# Patient Record
Sex: Female | Born: 1970 | Race: White | Hispanic: No | Marital: Married | Smoking: Never smoker
Health system: Southern US, Community
[De-identification: ages and names within clinical notes are randomized; demographics above are authoritative.]

## PROBLEM LIST (undated history)

## (undated) DIAGNOSIS — E079 Disorder of thyroid, unspecified: Secondary | ICD-10-CM

## (undated) DIAGNOSIS — I10 Essential (primary) hypertension: Secondary | ICD-10-CM

## (undated) DIAGNOSIS — D649 Anemia, unspecified: Secondary | ICD-10-CM

## (undated) HISTORY — PX: HERNIA REPAIR: SHX51

---

## 1997-09-25 ENCOUNTER — Inpatient Hospital Stay (HOSPITAL_COMMUNITY): Admission: AD | Admit: 1997-09-25 | Discharge: 1997-09-25 | Payer: Self-pay | Admitting: *Deleted

## 1997-10-21 ENCOUNTER — Observation Stay (HOSPITAL_COMMUNITY): Admission: AD | Admit: 1997-10-21 | Discharge: 1997-10-22 | Payer: Self-pay | Admitting: Obstetrics and Gynecology

## 1997-10-27 ENCOUNTER — Inpatient Hospital Stay (HOSPITAL_COMMUNITY): Admission: AD | Admit: 1997-10-27 | Discharge: 1997-10-27 | Payer: Self-pay | Admitting: Obstetrics and Gynecology

## 1997-10-29 ENCOUNTER — Inpatient Hospital Stay (HOSPITAL_COMMUNITY): Admission: AD | Admit: 1997-10-29 | Discharge: 1997-10-31 | Payer: Self-pay | Admitting: *Deleted

## 1997-11-01 ENCOUNTER — Encounter: Admission: RE | Admit: 1997-11-01 | Discharge: 1998-01-30 | Payer: Self-pay | Admitting: *Deleted

## 1999-07-24 ENCOUNTER — Ambulatory Visit (HOSPITAL_BASED_OUTPATIENT_CLINIC_OR_DEPARTMENT_OTHER): Admission: RE | Admit: 1999-07-24 | Discharge: 1999-07-24 | Payer: Self-pay | Admitting: General Surgery

## 2000-07-17 ENCOUNTER — Other Ambulatory Visit: Admission: RE | Admit: 2000-07-17 | Discharge: 2000-07-17 | Payer: Self-pay | Admitting: *Deleted

## 2000-09-15 ENCOUNTER — Inpatient Hospital Stay (HOSPITAL_COMMUNITY): Admission: AD | Admit: 2000-09-15 | Discharge: 2000-09-15 | Payer: Self-pay

## 2000-11-11 ENCOUNTER — Inpatient Hospital Stay (HOSPITAL_COMMUNITY): Admission: AD | Admit: 2000-11-11 | Discharge: 2000-11-11 | Payer: Self-pay | Admitting: *Deleted

## 2001-01-29 ENCOUNTER — Inpatient Hospital Stay (HOSPITAL_COMMUNITY): Admission: AD | Admit: 2001-01-29 | Discharge: 2001-01-29 | Payer: Self-pay | Admitting: *Deleted

## 2001-02-01 ENCOUNTER — Inpatient Hospital Stay (HOSPITAL_COMMUNITY): Admission: AD | Admit: 2001-02-01 | Discharge: 2001-02-01 | Payer: Self-pay | Admitting: *Deleted

## 2001-02-06 ENCOUNTER — Inpatient Hospital Stay (HOSPITAL_COMMUNITY): Admission: AD | Admit: 2001-02-06 | Discharge: 2001-02-06 | Payer: Self-pay | Admitting: *Deleted

## 2001-02-07 ENCOUNTER — Inpatient Hospital Stay (HOSPITAL_COMMUNITY): Admission: AD | Admit: 2001-02-07 | Discharge: 2001-02-07 | Payer: Self-pay | Admitting: *Deleted

## 2001-02-09 ENCOUNTER — Inpatient Hospital Stay (HOSPITAL_COMMUNITY): Admission: AD | Admit: 2001-02-09 | Discharge: 2001-02-09 | Payer: Self-pay | Admitting: *Deleted

## 2001-02-11 ENCOUNTER — Inpatient Hospital Stay (HOSPITAL_COMMUNITY): Admission: AD | Admit: 2001-02-11 | Discharge: 2001-02-13 | Payer: Self-pay | Admitting: *Deleted

## 2005-04-30 ENCOUNTER — Other Ambulatory Visit: Admission: RE | Admit: 2005-04-30 | Discharge: 2005-04-30 | Payer: Self-pay | Admitting: Obstetrics and Gynecology

## 2011-07-30 ENCOUNTER — Ambulatory Visit: Payer: Self-pay

## 2015-09-29 ENCOUNTER — Other Ambulatory Visit: Payer: Self-pay | Admitting: Obstetrics and Gynecology

## 2015-09-29 DIAGNOSIS — N63 Unspecified lump in unspecified breast: Secondary | ICD-10-CM

## 2015-10-06 ENCOUNTER — Ambulatory Visit
Admission: RE | Admit: 2015-10-06 | Discharge: 2015-10-06 | Disposition: A | Discharge status: Home / Self Care | Payer: Federal, State, Local not specified - PPO | Source: Ambulatory Visit | Attending: Obstetrics and Gynecology | Admitting: Obstetrics and Gynecology

## 2015-10-06 DIAGNOSIS — N63 Unspecified lump in unspecified breast: Secondary | ICD-10-CM

## 2016-02-08 DIAGNOSIS — K080 Exfoliation of teeth due to systemic causes: Secondary | ICD-10-CM | POA: Diagnosis not present

## 2016-02-09 DIAGNOSIS — K080 Exfoliation of teeth due to systemic causes: Secondary | ICD-10-CM | POA: Diagnosis not present

## 2016-02-15 DIAGNOSIS — K080 Exfoliation of teeth due to systemic causes: Secondary | ICD-10-CM | POA: Diagnosis not present

## 2016-03-12 DIAGNOSIS — K080 Exfoliation of teeth due to systemic causes: Secondary | ICD-10-CM | POA: Diagnosis not present

## 2016-03-16 DIAGNOSIS — E039 Hypothyroidism, unspecified: Secondary | ICD-10-CM | POA: Diagnosis not present

## 2016-03-16 DIAGNOSIS — I10 Essential (primary) hypertension: Secondary | ICD-10-CM | POA: Diagnosis not present

## 2016-03-16 DIAGNOSIS — E876 Hypokalemia: Secondary | ICD-10-CM | POA: Diagnosis not present

## 2016-03-21 DIAGNOSIS — E611 Iron deficiency: Secondary | ICD-10-CM | POA: Diagnosis not present

## 2016-03-21 DIAGNOSIS — R7301 Impaired fasting glucose: Secondary | ICD-10-CM | POA: Diagnosis not present

## 2016-03-21 DIAGNOSIS — E7800 Pure hypercholesterolemia, unspecified: Secondary | ICD-10-CM | POA: Diagnosis not present

## 2016-06-14 DIAGNOSIS — R0981 Nasal congestion: Secondary | ICD-10-CM | POA: Diagnosis not present

## 2016-06-14 DIAGNOSIS — H6993 Unspecified Eustachian tube disorder, bilateral: Secondary | ICD-10-CM | POA: Diagnosis not present

## 2016-08-30 DIAGNOSIS — R319 Hematuria, unspecified: Secondary | ICD-10-CM | POA: Insufficient documentation

## 2016-08-30 DIAGNOSIS — D649 Anemia, unspecified: Secondary | ICD-10-CM | POA: Insufficient documentation

## 2016-08-30 DIAGNOSIS — I10 Essential (primary) hypertension: Secondary | ICD-10-CM | POA: Insufficient documentation

## 2016-08-30 DIAGNOSIS — R1030 Lower abdominal pain, unspecified: Secondary | ICD-10-CM | POA: Diagnosis not present

## 2016-08-30 DIAGNOSIS — E039 Hypothyroidism, unspecified: Secondary | ICD-10-CM | POA: Insufficient documentation

## 2016-08-30 DIAGNOSIS — Z79899 Other long term (current) drug therapy: Secondary | ICD-10-CM | POA: Insufficient documentation

## 2016-08-30 DIAGNOSIS — R109 Unspecified abdominal pain: Secondary | ICD-10-CM | POA: Diagnosis not present

## 2016-08-31 ENCOUNTER — Emergency Department (HOSPITAL_COMMUNITY): Payer: Federal, State, Local not specified - PPO

## 2016-08-31 ENCOUNTER — Encounter (HOSPITAL_COMMUNITY): Payer: Self-pay

## 2016-08-31 ENCOUNTER — Emergency Department (HOSPITAL_COMMUNITY)
Admission: EM | Admit: 2016-08-31 | Discharge: 2016-08-31 | Disposition: A | Discharge status: Home / Self Care | Payer: Federal, State, Local not specified - PPO | Attending: Emergency Medicine | Admitting: Emergency Medicine

## 2016-08-31 DIAGNOSIS — R1084 Generalized abdominal pain: Secondary | ICD-10-CM | POA: Diagnosis not present

## 2016-08-31 DIAGNOSIS — R109 Unspecified abdominal pain: Secondary | ICD-10-CM | POA: Diagnosis not present

## 2016-08-31 DIAGNOSIS — R1030 Lower abdominal pain, unspecified: Secondary | ICD-10-CM

## 2016-08-31 DIAGNOSIS — D649 Anemia, unspecified: Secondary | ICD-10-CM

## 2016-08-31 DIAGNOSIS — R319 Hematuria, unspecified: Secondary | ICD-10-CM

## 2016-08-31 HISTORY — DX: Essential (primary) hypertension: I10

## 2016-08-31 HISTORY — DX: Anemia, unspecified: D64.9

## 2016-08-31 HISTORY — DX: Disorder of thyroid, unspecified: E07.9

## 2016-08-31 LAB — DIFFERENTIAL
BASOS PCT: 0 %
Basophils Absolute: 0 10*3/uL (ref 0.0–0.1)
EOS ABS: 0 10*3/uL (ref 0.0–0.7)
Eosinophils Relative: 0 %
LYMPHS ABS: 1.7 10*3/uL (ref 0.7–4.0)
Lymphocytes Relative: 8 %
MONO ABS: 0.9 10*3/uL (ref 0.1–1.0)
Monocytes Relative: 4 %
NEUTROS ABS: 18.7 10*3/uL — AB (ref 1.7–7.7)
NEUTROS PCT: 88 %

## 2016-08-31 LAB — CBC WITH DIFFERENTIAL/PLATELET
BASOS ABS: 0 10*3/uL (ref 0.0–0.1)
Basophils Relative: 0 %
Eosinophils Absolute: 0 10*3/uL (ref 0.0–0.7)
Eosinophils Relative: 0 %
HEMATOCRIT: 24.9 % — AB (ref 36.0–46.0)
Hemoglobin: 7 g/dL — ABNORMAL LOW (ref 12.0–15.0)
LYMPHS ABS: 2.1 10*3/uL (ref 0.7–4.0)
Lymphocytes Relative: 12 %
MCH: 17 pg — ABNORMAL LOW (ref 26.0–34.0)
MCHC: 28.1 g/dL — AB (ref 30.0–36.0)
MCV: 60.6 fL — ABNORMAL LOW (ref 78.0–100.0)
MONO ABS: 0.5 10*3/uL (ref 0.1–1.0)
MONOS PCT: 3 %
NEUTROS ABS: 14.5 10*3/uL — AB (ref 1.7–7.7)
Neutrophils Relative %: 85 %
Platelets: 287 10*3/uL (ref 150–400)
RBC: 4.11 MIL/uL (ref 3.87–5.11)
RDW: 18.1 % — AB (ref 11.5–15.5)
WBC: 17.1 10*3/uL — ABNORMAL HIGH (ref 4.0–10.5)

## 2016-08-31 LAB — COMPREHENSIVE METABOLIC PANEL
ALT: 36 U/L (ref 14–54)
ANION GAP: 12 (ref 5–15)
AST: 27 U/L (ref 15–41)
Albumin: 3.9 g/dL (ref 3.5–5.0)
Alkaline Phosphatase: 104 U/L (ref 38–126)
BUN: 7 mg/dL (ref 6–20)
CALCIUM: 9.5 mg/dL (ref 8.9–10.3)
CO2: 25 mmol/L (ref 22–32)
CREATININE: 0.78 mg/dL (ref 0.44–1.00)
Chloride: 100 mmol/L — ABNORMAL LOW (ref 101–111)
Glucose, Bld: 132 mg/dL — ABNORMAL HIGH (ref 65–99)
Potassium: 3.3 mmol/L — ABNORMAL LOW (ref 3.5–5.1)
SODIUM: 137 mmol/L (ref 135–145)
Total Bilirubin: 1.2 mg/dL (ref 0.3–1.2)
Total Protein: 7.3 g/dL (ref 6.5–8.1)

## 2016-08-31 LAB — URINALYSIS, ROUTINE W REFLEX MICROSCOPIC
BILIRUBIN URINE: NEGATIVE
Bacteria, UA: NONE SEEN
GLUCOSE, UA: NEGATIVE mg/dL
KETONES UR: NEGATIVE mg/dL
Nitrite: NEGATIVE
PROTEIN: NEGATIVE mg/dL
Specific Gravity, Urine: 1.017 (ref 1.005–1.030)
pH: 7 (ref 5.0–8.0)

## 2016-08-31 LAB — IRON AND TIBC
Iron: 11 ug/dL — ABNORMAL LOW (ref 28–170)
Saturation Ratios: 2 % — ABNORMAL LOW (ref 10.4–31.8)
TIBC: 553 ug/dL — AB (ref 250–450)
UIBC: 542 ug/dL

## 2016-08-31 LAB — VITAMIN B12: Vitamin B-12: 328 pg/mL (ref 180–914)

## 2016-08-31 LAB — CBC
HCT: 29.1 % — ABNORMAL LOW (ref 36.0–46.0)
HEMOGLOBIN: 8 g/dL — AB (ref 12.0–15.0)
MCH: 16.7 pg — ABNORMAL LOW (ref 26.0–34.0)
MCHC: 27.5 g/dL — ABNORMAL LOW (ref 30.0–36.0)
MCV: 60.9 fL — ABNORMAL LOW (ref 78.0–100.0)
PLATELETS: 341 10*3/uL (ref 150–400)
RBC: 4.78 MIL/uL (ref 3.87–5.11)
RDW: 17.6 % — ABNORMAL HIGH (ref 11.5–15.5)
WBC: 21.3 10*3/uL — AB (ref 4.0–10.5)

## 2016-08-31 LAB — LIPASE, BLOOD: Lipase: 23 U/L (ref 11–51)

## 2016-08-31 LAB — FERRITIN: FERRITIN: 5 ng/mL — AB (ref 11–307)

## 2016-08-31 LAB — PREGNANCY, URINE: PREG TEST UR: NEGATIVE

## 2016-08-31 LAB — FOLATE: Folate: 11 ng/mL (ref >5.9)

## 2016-08-31 LAB — RETICULOCYTES
RBC.: 4.58 MIL/uL (ref 3.87–5.11)
RETIC COUNT ABSOLUTE: 68.7 10*3/uL (ref 19.0–186.0)
RETIC CT PCT: 1.5 % (ref 0.4–3.1)

## 2016-08-31 MED ORDER — PROMETHAZINE HCL 25 MG PO TABS: 25.0000 mg | ORAL_TABLET | Freq: Four times a day (QID) | ORAL | 0 refills | Status: AC | PRN

## 2016-08-31 MED ORDER — CEPHALEXIN 500 MG PO CAPS: 500.0000 mg | ORAL_CAPSULE | Freq: Four times a day (QID) | ORAL | 0 refills | Status: DC

## 2016-08-31 MED ORDER — SODIUM CHLORIDE 0.9 % IV BOLUS (SEPSIS)
1000.0000 mL | Freq: Once | INTRAVENOUS | Status: AC
  Administered 2016-08-31: 1000 mL via INTRAVENOUS

## 2016-08-31 MED ORDER — IOPAMIDOL (ISOVUE-300) INJECTION 61%
INTRAVENOUS | Status: AC
  Administered 2016-08-31: 100 mL
  Filled 2016-08-31: qty 100

## 2016-08-31 MED ORDER — MORPHINE SULFATE (PF) 4 MG/ML IV SOLN
4.0000 mg | Freq: Once | INTRAVENOUS | Status: AC
  Administered 2016-08-31: 4 mg via INTRAVENOUS
  Filled 2016-08-31: qty 1

## 2016-08-31 MED ORDER — ONDANSETRON HCL 4 MG/2ML IJ SOLN
4.0000 mg | Freq: Once | INTRAMUSCULAR | Status: AC
  Administered 2016-08-31: 4 mg via INTRAVENOUS
  Filled 2016-08-31: qty 2

## 2016-08-31 MED ORDER — HYDROCODONE-ACETAMINOPHEN 5-325 MG PO TABS: 1.0000 | ORAL_TABLET | Freq: Four times a day (QID) | ORAL | 0 refills | Status: DC | PRN

## 2016-08-31 NOTE — Discharge Instructions (Signed)
Return for worse abdominal pain. Glad to hear that you're feeling better. Take the antibiotic for the hematuria. Urine culture sent. Important to follow-up with your regular doctor or your GYN doctor. You do have evidence of the anemia would recommend start taking the iron. Prescription for Keflex for the blood in the urine need to follow-up to make sure that it clears. Also take hydrocodone in the Phenergan as needed for abdominal pain and nausea.

## 2016-08-31 NOTE — ED Provider Notes (Addendum)
Patient turned over by Dr. Preston FleetingGlick. Patient to belly pain is now significant improved. Was evaluated by general surgery PA and then attending but in the meantime belly pain got sniffily better. Does have blood in the urine and no vaginal bleeding currently so sent urine culture and will treat with Keflex in case this is due to infection. Do not feel the patient's pain was all related to this. Patient now stable for discharge home does have follow-up with primary care doctor and GYN doctor. We'll have her urine rechecked will have her blood counts recheck she will start her iron and she will take her Keflex. She will return for any new or worse symptoms.  Abdomen is nontender on exam at this point in time.   Vanetta MuldersScott Valisa Karpel, MD 08/31/16 1043    Vanetta MuldersScott Shaundrea Carrigg, MD 08/31/16 1043

## 2016-08-31 NOTE — ED Notes (Signed)
Pt has a hx of umbilical hernia repair.

## 2016-08-31 NOTE — ED Provider Notes (Signed)
[facility]-EMERGENCY DEPT Provider Note   CSN: [id number] Arrival date & time: 08/30/16  2358   By signing my name below, I, Clovis PuAvnee Patel, attest that this documentation has been prepared under the direction and in the presence of Dione Boozeavid Mozella Rexrode, MD  Electronically Signed: Clovis PuAvnee Patel, ED Scribe. 08/31/16. 2:54 AM.   History   Chief Complaint Chief Complaint  Patient presents with  . Abdominal Pain   The history is provided by the patient. No language interpreter was used.   HPI Comments:  Suzanne Norton is a 46 y.o. female, with a hx of thyroid disease, HTN, anemia, umbilical hernia and PSHx of hernia repair, who presents to the Emergency Department complaining of persistent mid abdominal pain onset 1 week which worsened in the PM yesterday. Her pain is worse upon palpation. Pt also reports an episode of lightheadedness, nausea and chills. No alleviating factors noted. Pt denies fevers, diaphoresis, vomiting or any other associated symptoms. Pt notes she is currently not on her menstrual cycle and states that her cycle is irregular. Pt notes her HGB was 8.8 on 03/2016.  Past Medical History:  Diagnosis Date  . Anemia   . Hypertension   . Thyroid disease     There are no active problems to display for this patient.   Past Surgical History:  Procedure Laterality Date  . HERNIA REPAIR      OB History    No data available       Home Medications    Prior to Admission medications   Not on File    Family History No family history on file.  Social History Social History  Substance Use Topics  . Smoking status: Never Smoker  . Smokeless tobacco: Never Used  . Alcohol use No     Allergies   Patient has no known allergies.   Review of Systems Review of Systems  Constitutional: Positive for chills. Negative for diaphoresis and fever.  Gastrointestinal: Positive for abdominal pain and nausea. Negative for vomiting.  Genitourinary: Negative for vaginal bleeding.    Neurological: Positive for light-headedness.  All other systems reviewed and are negative.  Physical Exam Updated Vital Signs BP 158/75   Pulse 98   Temp 98.6 F (37 C) (Oral)   Resp 18   LMP 08/07/2016   SpO2 100%   Physical Exam  Constitutional: She is oriented to person, place, and time. She appears well-developed and well-nourished.  HENT:  Head: Normocephalic and atraumatic.  Eyes: EOM are normal. Pupils are equal, round, and reactive to light.  Neck: Normal range of motion. Neck supple. No JVD present.  Cardiovascular: Normal rate, regular rhythm and normal heart sounds.   No murmur heard. Pulmonary/Chest: Effort normal and breath sounds normal. She has no wheezes. She has no rales. She exhibits no tenderness.  Abdominal: Soft. Bowel sounds are normal. She exhibits no distension and no mass. There is tenderness in the epigastric area. There is rebound.  Diffuse tenderness with rebound tenderness. Maximum tenderness in midline. Worst tenderness of the epigastric area.   Musculoskeletal: Normal range of motion. She exhibits no edema.  Lymphadenopathy:    She has no cervical adenopathy.  Neurological: She is alert and oriented to person, place, and time. No cranial nerve deficit. She exhibits normal muscle tone. Coordination normal.  Skin: Skin is warm and dry. No rash noted.  Psychiatric: She has a normal mood and affect. Her behavior is normal. Judgment and thought content normal.  Nursing note and vitals reviewed.  ED Treatments / Results  DIAGNOSTIC STUDIES:  Oxygen Saturation is 100% on RA, normal by my interpretation.    COORDINATION OF CARE:  2:51 AM Discussed treatment plan with pt at bedside and pt agreed to plan.  Labs (all labs ordered are listed, but only abnormal results are displayed) Labs Reviewed  URINE CULTURE - Abnormal; Notable for the following:       Result Value   Culture MULTIPLE SPECIES PRESENT, SUGGEST RECOLLECTION (*)    All other  components within normal limits  COMPREHENSIVE METABOLIC PANEL - Abnormal; Notable for the following:    Potassium 3.3 (*)    Chloride 100 (*)    Glucose, Bld 132 (*)    All other components within normal limits  CBC - Abnormal; Notable for the following:    WBC 21.3 (*)    Hemoglobin 8.0 (*)    HCT 29.1 (*)    MCV 60.9 (*)    MCH 16.7 (*)    MCHC 27.5 (*)    RDW 17.6 (*)    All other components within normal limits  URINALYSIS, ROUTINE W REFLEX MICROSCOPIC - Abnormal; Notable for the following:    APPearance HAZY (*)    Hgb urine dipstick LARGE (*)    Leukocytes, UA SMALL (*)    Squamous Epithelial / LPF 0-5 (*)    All other components within normal limits  DIFFERENTIAL - Abnormal; Notable for the following:    Neutro Abs 18.7 (*)    All other components within normal limits  IRON AND TIBC - Abnormal; Notable for the following:    Iron 11 (*)    TIBC 553 (*)    Saturation Ratios 2 (*)    All other components within normal limits  FERRITIN - Abnormal; Notable for the following:    Ferritin 5 (*)    All other components within normal limits  CBC WITH DIFFERENTIAL/PLATELET - Abnormal; Notable for the following:    WBC 17.1 (*)    Hemoglobin 7.0 (*)    HCT 24.9 (*)    MCV 60.6 (*)    MCH 17.0 (*)    MCHC 28.1 (*)    RDW 18.1 (*)    Neutro Abs 14.5 (*)    All other components within normal limits  LIPASE, BLOOD  VITAMIN B12  FOLATE  RETICULOCYTES  PREGNANCY, URINE    Radiology Ct Abdomen Pelvis W Contrast  Result Date: 08/31/2016 CLINICAL DATA:  Abdominal pain for the past week on and off, worse today. Constipation today. EXAM: CT ABDOMEN AND PELVIS WITH CONTRAST TECHNIQUE: Multidetector CT imaging of the abdomen and pelvis was performed using the standard protocol following bolus administration of intravenous contrast. CONTRAST:  ISOVUE-300 IOPAMIDOL (ISOVUE-300) INJECTION 61% COMPARISON:  None. FINDINGS: Lower chest: Lung bases are clear. Hepatobiliary: No  focal liver abnormality is seen. No gallstones, gallbladder wall thickening, or biliary dilatation. Diffuse fatty infiltration of the liver. Pancreas: Unremarkable. No pancreatic ductal dilatation or surrounding inflammatory changes. Spleen: Normal in size without focal abnormality. Adrenals/Urinary Tract: Adrenal glands are unremarkable. Kidneys are normal, without renal calculi, focal lesion, or hydronephrosis. Bladder is unremarkable. Stomach/Bowel: Stomach is within normal limits. Appendix appears normal. No evidence of bowel wall thickening, distention, or inflammatory changes. Vascular/Lymphatic: No significant vascular findings are present. No enlarged abdominal or pelvic lymph nodes. Reproductive: Uterus is anteverted. Nodular appearance to the uterus consistent with fibroids. Ovaries are not enlarged. Other: No abdominal wall hernia or abnormality. No abdominopelvic ascites. Musculoskeletal: No acute or  significant osseous findings. IMPRESSION: No acute process demonstrated in the abdomen or pelvis. No evidence of bowel obstruction or inflammation. Uterine fibroids. Fatty infiltration of the liver. Electronically Signed   By: Burman Nieves M.D.   On: 08/31/2016 04:35    Procedures Procedures (including critical care time)  Medications Ordered in ED Medications  sodium chloride 0.9 % bolus 1,000 mL (0 mLs Intravenous Stopped 08/31/16 0413)  ondansetron (ZOFRAN) injection 4 mg (4 mg Intravenous Given 08/31/16 0323)  morphine 4 MG/ML injection 4 mg (4 mg Intravenous Given 08/31/16 0323)  iopamidol (ISOVUE-300) 61 % injection (100 mLs  Contrast Given 08/31/16 0402)     Initial Impression / Assessment and Plan / ED Course  I have reviewed the triage vital signs and the nursing notes.  Pertinent labs & imaging results that were available during my care of the patient were reviewed by me and considered in my medical decision making (see chart for details).  Abdominal pain of uncertain cause. She  has clear signs of generalized peritonitis, but pain is and tenderness are not well localized. She is sent for CT of abdomen and pelvis.  CT scan is unremarkable, but WBC is markedly elevated. Patient was observed in the ED and that CBC repeated showing modest drop in WBC but also drop in hemoglobin. Patient has known history of chronic anemia which is felt to be due to iron deficiency. Exam was repeated and tenderness now seems to be more localized into the right lower quadrant but still with rebound tenderness. I am concerned about her findings in spite of the negative CT scan. Gen. surgery has been consulted and will come to evaluate the patient in the ED. Case is turned over to Dr. Deretha Emory.  Final Clinical Impressions(s) / ED Diagnoses   Final diagnoses:  Lower abdominal pain  Hematuria, unspecified type  Anemia, unspecified type    New Prescriptions New Prescriptions   No medications on file   I personally performed the services described in this documentation, which was scribed in my presence. The recorded information has been reviewed and is accurate.       Dione Booze, MD 09/02/16 [phone]

## 2016-08-31 NOTE — Consult Note (Signed)
[facility] Surgery Consult/Admission Note  Suzanne Norton [birth year]  [id number].    Requesting MD: Dr. Preston Fleeting Chief Complaint/Reason for Consult: abdominal pain  HPI:   Pt is a 46 year old obese female with a past medical history significant for hypothyroidism, hypertension, iron deficiency anemia, umbilical hernia repair (2000), who presented to [facility] emergency department last night with complaints of abdominal pain for 1 week. Patient states she's been having mild, intermittent abdominal pain around her umbilical region for one week that progressively got worse yesterday around 5 PM. Patient states she had pain with a bowel movement and then the abdominal pain became constant, severe, 10/10, worse with movement, worse with riding in the car. Patient states roughly 2 hours later her abdominal pain decreased to a dull/constant, 2/10. At that time she had increased flatulence and chills. She was still having increased pain with bumps in the car. Pain currently at rest is a 2/10, constant, mild, nonradiating. She did not take anything for this prior to arrival to the ED. Patient states she had hematuria yesterday. She denies dysuria, nausea, vomiting, fever, diarrhea, blood in her stool, abnormal vaginal discharge, chest pain, shortness of breath, headaches, dizziness, loss consciousness. Patient states she is probably due to start her menstrual period soon. She does not member when her last LMP was.  ED course: Labs: Potassium 3.3, glucose 132, WBC 17.1, Hgb 7, iron 11  CT scan abdomen/pelvis: No acute process demonstrated in the abdomen or pelvis. No evidence of bowel obstruction or inflammation. Uterine fibroids. Fatty infiltration of the liver.  ROS:  Review of Systems  Constitutional: Positive for chills. Negative for diaphoresis and fever.  HENT: Negative for sore throat.   Eyes: Negative for discharge.  Respiratory: Negative for cough and shortness of breath.   Cardiovascular:  Negative for chest pain.  Gastrointestinal: Positive for abdominal pain. Negative for blood in stool, constipation, diarrhea, nausea and vomiting.  Genitourinary: Positive for hematuria. Negative for dysuria.  Musculoskeletal: Negative for back pain.  Skin: Negative for rash.  Neurological: Negative for dizziness, loss of consciousness and headaches.  All other systems reviewed and are negative.    No family history on file.  Past Medical History:  Diagnosis Date  . Anemia   . Hypertension   . Thyroid disease     Past Surgical History:  Procedure Laterality Date  . HERNIA REPAIR      Social History:  reports that she has never smoked. She has never used smokeless tobacco. She reports that she does not drink alcohol. Her drug history is not on file.  Allergies: No Known Allergies   (Not in a hospital admission)  Blood pressure 129/67, pulse 70, temperature 98.6 F (37 C), temperature source Oral, resp. rate 17, last menstrual period 08/07/2016, SpO2 100 %.  Physical Exam  Constitutional: She is oriented to person, place, and time and well-developed, well-nourished, and in no distress. Vital signs are normal. No distress.  Obese, white female, well-appearing  HENT:  Head: Normocephalic and atraumatic.  Nose: Nose normal.  Mouth/Throat: Oropharynx is clear and moist.  Eyes: Conjunctivae are normal. Right eye exhibits no discharge. Left eye exhibits no discharge. No scleral icterus.  Neck: Normal range of motion. Neck supple.  Cardiovascular: Normal rate, regular rhythm, normal heart sounds and intact distal pulses.   Pulses:      Radial pulses are 2+ on the right side, and 2+ on the left side.       Dorsalis pedis pulses are  2+ on the right side, and 2+ on the left side.  Pulmonary/Chest: Effort normal and breath sounds normal. No respiratory distress. She has no decreased breath sounds. She has no wheezes. She has no rhonchi. She has no rales.  Abdominal: Soft. Bowel  sounds are normal. She exhibits no distension and no mass. There is generalized tenderness. There is no rigidity, no rebound, no guarding, no CVA tenderness, no tenderness at McBurney's point and negative Murphy's sign.  Obese abdomen, small roughly 4 cm scar noted just inferior to the umbilicus, no increased tympany, no distention, generalized TTP worse in the RLQ, LLQ, inferior to the umbilicus. No rebound, guarding, rigidity.  Musculoskeletal: Normal range of motion. She exhibits no edema or deformity.  Neurological: She is alert and oriented to person, place, and time.  Skin: Skin is warm and dry. No rash noted. She is not diaphoretic.  Psychiatric: Mood and affect normal.  Nursing note and vitals reviewed.   Results for orders placed or performed during the hospital encounter of 08/31/16 (from the past 48 hour(s))  Lipase, blood     Status: None   Collection Time: 08/31/16 12:12 AM  Result Value Ref Range   Lipase 23 11 - 51 U/L  Comprehensive metabolic panel     Status: Abnormal   Collection Time: 08/31/16 12:12 AM  Result Value Ref Range   Sodium 137 135 - 145 mmol/L   Potassium 3.3 (L) 3.5 - 5.1 mmol/L   Chloride 100 (L) 101 - 111 mmol/L   CO2 25 22 - 32 mmol/L   Glucose, Bld 132 (H) 65 - 99 mg/dL   BUN 7 6 - 20 mg/dL   Creatinine, Ser 0.78 0.44 - 1.00 mg/dL   Calcium 9.5 8.9 - 10.3 mg/dL   Total Protein 7.3 6.5 - 8.1 g/dL   Albumin 3.9 3.5 - 5.0 g/dL   AST 27 15 - 41 U/L   ALT 36 14 - 54 U/L   Alkaline Phosphatase 104 38 - 126 U/L   Total Bilirubin 1.2 0.3 - 1.2 mg/dL   GFR calc non Af Norton >60 >60 mL/min   GFR calc Af Norton >60 >60 mL/min    Comment: (NOTE) The eGFR has been calculated using the CKD EPI equation. This calculation has not been validated in all clinical situations. eGFR's persistently <60 mL/min signify possible Chronic Kidney Disease.    Anion gap 12 5 - 15  CBC     Status: Abnormal   Collection Time: 08/31/16 12:12 AM  Result Value Ref Range   WBC  21.3 (H) 4.0 - 10.5 K/uL   RBC 4.78 3.87 - 5.11 MIL/uL   Hemoglobin 8.0 (L) 12.0 - 15.0 g/dL   HCT 29.1 (L) 36.0 - 46.0 %   MCV 60.9 (L) 78.0 - 100.0 fL   MCH 16.7 (L) 26.0 - 34.0 pg   MCHC 27.5 (L) 30.0 - 36.0 g/dL   RDW 17.6 (H) 11.5 - 15.5 %   Platelets 341 150 - 400 K/uL  Urinalysis, Routine w reflex microscopic     Status: Abnormal   Collection Time: 08/31/16 12:12 AM  Result Value Ref Range   Color, Urine YELLOW YELLOW   APPearance HAZY (A) CLEAR   Specific Gravity, Urine 1.017 1.005 - 1.030   pH 7.0 5.0 - 8.0   Glucose, UA NEGATIVE NEGATIVE mg/dL   Hgb urine dipstick LARGE (A) NEGATIVE   Bilirubin Urine NEGATIVE NEGATIVE   Ketones, ur NEGATIVE NEGATIVE mg/dL   Protein, ur NEGATIVE  NEGATIVE mg/dL   Nitrite NEGATIVE NEGATIVE   Leukocytes, UA SMALL (A) NEGATIVE   RBC / HPF TOO NUMEROUS TO COUNT 0 - 5 RBC/hpf   WBC, UA 6-30 0 - 5 WBC/hpf   Bacteria, UA NONE SEEN NONE SEEN   Squamous Epithelial / LPF 0-5 (A) NONE SEEN  Differential     Status: Abnormal   Collection Time: 08/31/16 12:12 AM  Result Value Ref Range   Neutrophils Relative % 88 %   Lymphocytes Relative 8 %   Monocytes Relative 4 %   Eosinophils Relative 0 %   Basophils Relative 0 %   Neutro Abs 18.7 (H) 1.7 - 7.7 K/uL   Lymphs Abs 1.7 0.7 - 4.0 K/uL   Monocytes Absolute 0.9 0.1 - 1.0 K/uL   Eosinophils Absolute 0.0 0.0 - 0.7 K/uL   Basophils Absolute 0.0 0.0 - 0.1 K/uL   RBC Morphology POLYCHROMASIA PRESENT     Comment: ELLIPTOCYTES  Pregnancy, urine     Status: None   Collection Time: 08/31/16 12:12 AM  Result Value Ref Range   Preg Test, Ur NEGATIVE NEGATIVE    Comment:        THE SENSITIVITY OF THIS METHODOLOGY IS >20 mIU/mL.   Vitamin B12     Status: None   Collection Time: 08/31/16  3:10 AM  Result Value Ref Range   Vitamin B-12 328 180 - 914 pg/mL    Comment: (NOTE) This assay is not validated for testing neonatal or myeloproliferative syndrome specimens for Vitamin B12 levels.   Folate      Status: None   Collection Time: 08/31/16  3:10 AM  Result Value Ref Range   Folate 11.0 >5.9 ng/mL  Iron and TIBC     Status: Abnormal   Collection Time: 08/31/16  3:10 AM  Result Value Ref Range   Iron 11 (L) 28 - 170 ug/dL   TIBC 553 (H) 250 - 450 ug/dL   Saturation Ratios 2 (L) 10.4 - 31.8 %   UIBC 542 ug/dL  Ferritin     Status: Abnormal   Collection Time: 08/31/16  3:10 AM  Result Value Ref Range   Ferritin 5 (L) 11 - 307 ng/mL  Reticulocytes     Status: None   Collection Time: 08/31/16  3:10 AM  Result Value Ref Range   Retic Ct Pct 1.5 0.4 - 3.1 %   RBC. 4.58 3.87 - 5.11 MIL/uL   Retic Count, Manual 68.7 19.0 - 186.0 K/uL  CBC with Differential     Status: Abnormal   Collection Time: 08/31/16  5:52 AM  Result Value Ref Range   WBC 17.1 (H) 4.0 - 10.5 K/uL   RBC 4.11 3.87 - 5.11 MIL/uL   Hemoglobin 7.0 (L) 12.0 - 15.0 g/dL   HCT 24.9 (L) 36.0 - 46.0 %   MCV 60.6 (L) 78.0 - 100.0 fL   MCH 17.0 (L) 26.0 - 34.0 pg   MCHC 28.1 (L) 30.0 - 36.0 g/dL   RDW 18.1 (H) 11.5 - 15.5 %   Platelets 287 150 - 400 K/uL   Neutrophils Relative % 85 %   Lymphocytes Relative 12 %   Monocytes Relative 3 %   Eosinophils Relative 0 %   Basophils Relative 0 %   Neutro Abs 14.5 (H) 1.7 - 7.7 K/uL   Lymphs Abs 2.1 0.7 - 4.0 K/uL   Monocytes Absolute 0.5 0.1 - 1.0 K/uL   Eosinophils Absolute 0.0 0.0 - 0.7 K/uL   Basophils  Absolute 0.0 0.0 - 0.1 K/uL   RBC Morphology HYPOCHROMIA     Comment: ELLIPTOCYTES   Ct Abdomen Pelvis W Contrast  Result Date: 08/31/2016 CLINICAL DATA:  Abdominal pain for the past week on and off, worse today. Constipation today. EXAM: CT ABDOMEN AND PELVIS WITH CONTRAST TECHNIQUE: Multidetector CT imaging of the abdomen and pelvis was performed using the standard protocol following bolus administration of intravenous contrast. CONTRAST:  161m ISOVUE-300 IOPAMIDOL (ISOVUE-300) INJECTION 61% COMPARISON:  None. FINDINGS: Lower chest: Lung bases are clear. Hepatobiliary:  No focal liver abnormality is seen. No gallstones, gallbladder wall thickening, or biliary dilatation. Diffuse fatty infiltration of the liver. Pancreas: Unremarkable. No pancreatic ductal dilatation or surrounding inflammatory changes. Spleen: Normal in size without focal abnormality. Adrenals/Urinary Tract: Adrenal glands are unremarkable. Kidneys are normal, without renal calculi, focal lesion, or hydronephrosis. Bladder is unremarkable. Stomach/Bowel: Stomach is within normal limits. Appendix appears normal. No evidence of bowel wall thickening, distention, or inflammatory changes. Vascular/Lymphatic: No significant vascular findings are present. No enlarged abdominal or pelvic lymph nodes. Reproductive: Uterus is anteverted. Nodular appearance to the uterus consistent with fibroids. Ovaries are not enlarged. Other: No abdominal wall hernia or abnormality. No abdominopelvic ascites. Musculoskeletal: No acute or significant osseous findings. IMPRESSION: No acute process demonstrated in the abdomen or pelvis. No evidence of bowel obstruction or inflammation. Uterine fibroids. Fatty infiltration of the liver. Electronically Signed   By: WLucienne CapersM.D.   On: 08/31/2016 04:35    Assessment/Plan  HTN Hypothyroidism And deficiency anemia  Abdominal pain  - Patient's abdominal pain has improved  - CT scan revealed no acute abdominal process to explain patient's symptoms, no appendicitis noted  - On exam patient has generalized abdominal tenderness no focal tenderness to suggest appendicitis, TTP equal on left and right side of lower abdomen  - leukocytosis: unsure of etiology but likely 2/2 viral enteritis. Patient is afebrile, not tachycardic. Vital signs are stable.  No acute need for surgical intervention at this time. We recommended fluids and symptomatic treatment   Thank you for the consult.   JKalman Drape PNorth [facility]ar[facility] Specialty HospitalSurgery 08/31/2016, 9:25 AM Pager:  [phone]: [phone]-Fri 7:00 am-4:30 pm Sat-Sun 7:00 am-11:30 am

## 2016-08-31 NOTE — ED Notes (Signed)
Ambulated pt to restroom, no issues. Pt back on monitor. 

## 2016-08-31 NOTE — ED Triage Notes (Signed)
Pt states that for the past week has been having abd pain on and off, worse today, reports some constipation today when using the bathroom, pain is lower, and some chills, no nausea/v, pt had a hernia repair 17 years ago

## 2016-09-01 LAB — URINE CULTURE

## 2016-10-15 DIAGNOSIS — R030 Elevated blood-pressure reading, without diagnosis of hypertension: Secondary | ICD-10-CM | POA: Diagnosis not present

## 2016-10-15 DIAGNOSIS — J181 Lobar pneumonia, unspecified organism: Secondary | ICD-10-CM | POA: Diagnosis not present

## 2016-10-24 DIAGNOSIS — E039 Hypothyroidism, unspecified: Secondary | ICD-10-CM | POA: Diagnosis not present

## 2016-10-24 DIAGNOSIS — R7303 Prediabetes: Secondary | ICD-10-CM | POA: Diagnosis not present

## 2016-10-24 DIAGNOSIS — I10 Essential (primary) hypertension: Secondary | ICD-10-CM | POA: Diagnosis not present

## 2016-10-24 DIAGNOSIS — E611 Iron deficiency: Secondary | ICD-10-CM | POA: Diagnosis not present

## 2016-12-29 DIAGNOSIS — J069 Acute upper respiratory infection, unspecified: Secondary | ICD-10-CM | POA: Diagnosis not present

## 2017-02-04 DIAGNOSIS — E039 Hypothyroidism, unspecified: Secondary | ICD-10-CM | POA: Diagnosis not present

## 2017-02-04 DIAGNOSIS — I10 Essential (primary) hypertension: Secondary | ICD-10-CM | POA: Diagnosis not present

## 2017-02-04 DIAGNOSIS — D509 Iron deficiency anemia, unspecified: Secondary | ICD-10-CM | POA: Diagnosis not present

## 2017-02-04 DIAGNOSIS — E876 Hypokalemia: Secondary | ICD-10-CM | POA: Diagnosis not present

## 2017-03-13 DIAGNOSIS — K080 Exfoliation of teeth due to systemic causes: Secondary | ICD-10-CM | POA: Diagnosis not present

## 2017-04-16 DIAGNOSIS — J029 Acute pharyngitis, unspecified: Secondary | ICD-10-CM | POA: Diagnosis not present

## 2018-01-26 IMAGING — CT CT ABD-PELV W/ CM
2 of 5 series · 11 of 46 positions shown, 12 images · IV contrast (Iodine)
Comparison: None.

CLINICAL DATA: Abdominal pain for the past week on and off, worse
today. Constipation today.

EXAM:
CT ABDOMEN AND PELVIS WITH CONTRAST
TECHNIQUE: Multidetector CT imaging of the abdomen and pelvis was performed
using the standard protocol following bolus administration of
intravenous contrast.
CONTRAST:  100mL [id number] IOPAMIDOL ([id number]) INJECTION 61%

[Series 201: routine, idose (2) · axial · 0.78mm/px · z∈[-507,-127]mm · 8 of 98 slices shown, 9 images]
[im 11/98  soft-tissue]
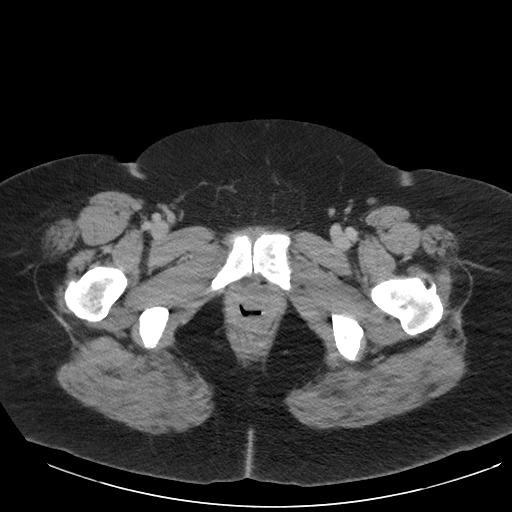
[im 11/98  bone]
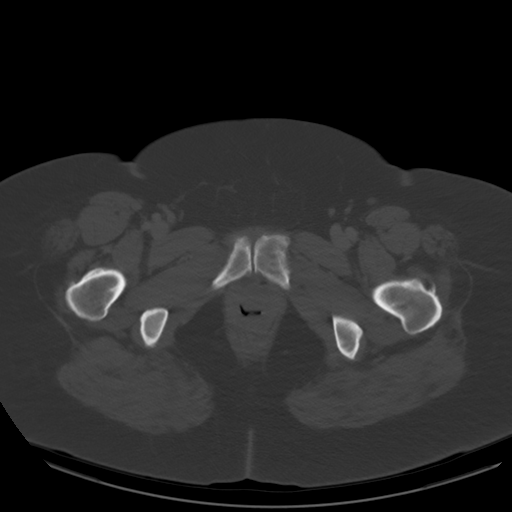
[im 21/98  soft-tissue]
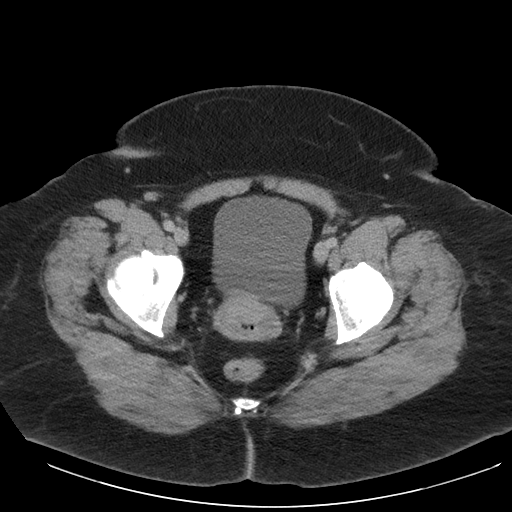
[im 31/98  soft-tissue]
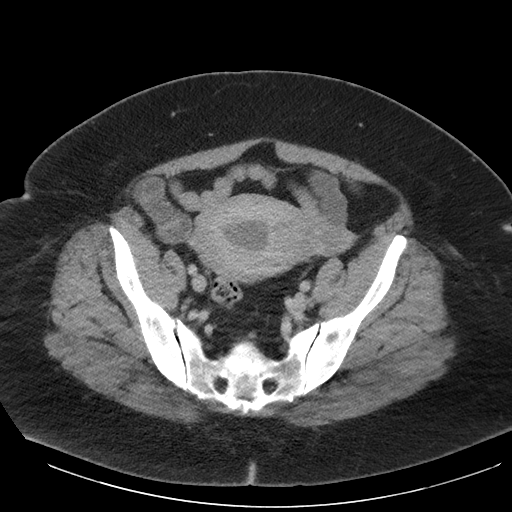
[im 41/98  soft-tissue]
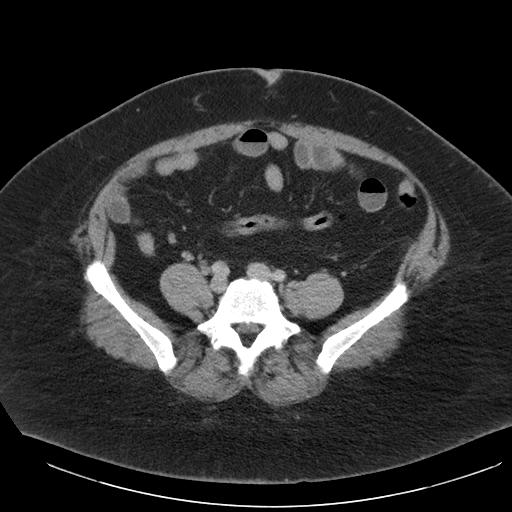
[im 57/98  soft-tissue]
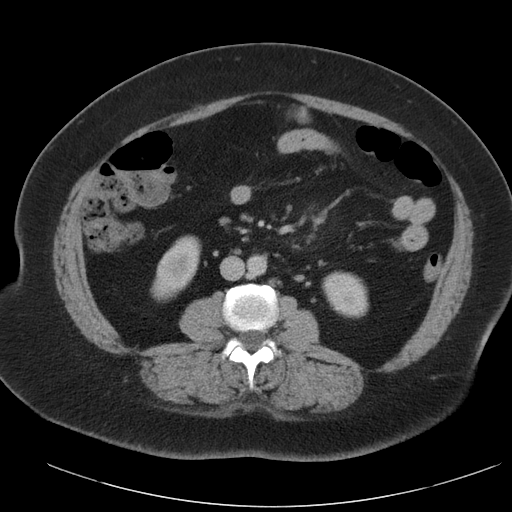
[im 67/98  soft-tissue]
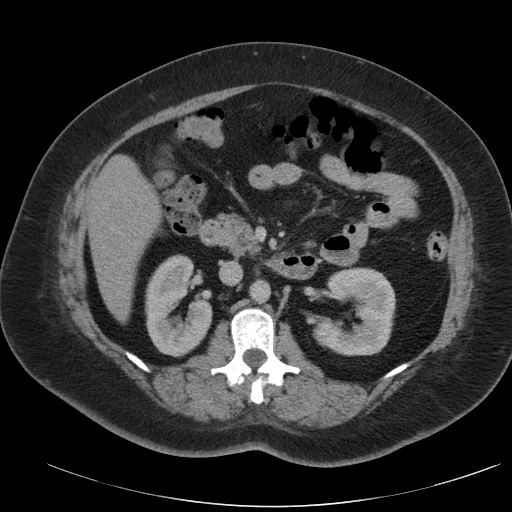
[im 77/98  soft-tissue]
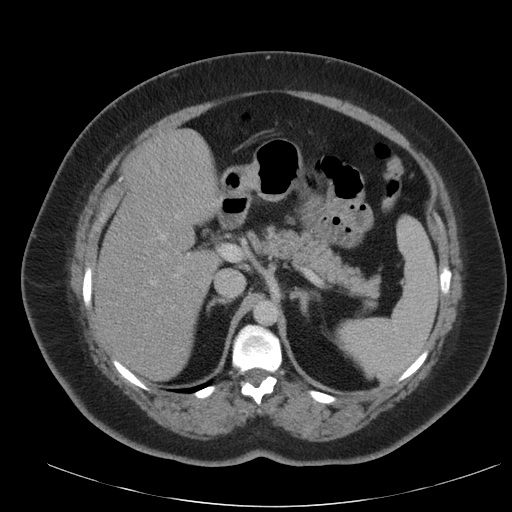
[im 87/98  soft-tissue]
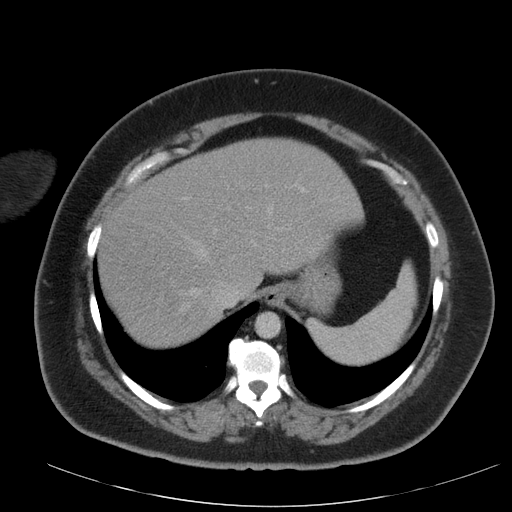

[Series 203: coronals, idose (2) · coronal · 0.45mm/px · 3 of 148 slices shown]
[im 50/148  soft-tissue]
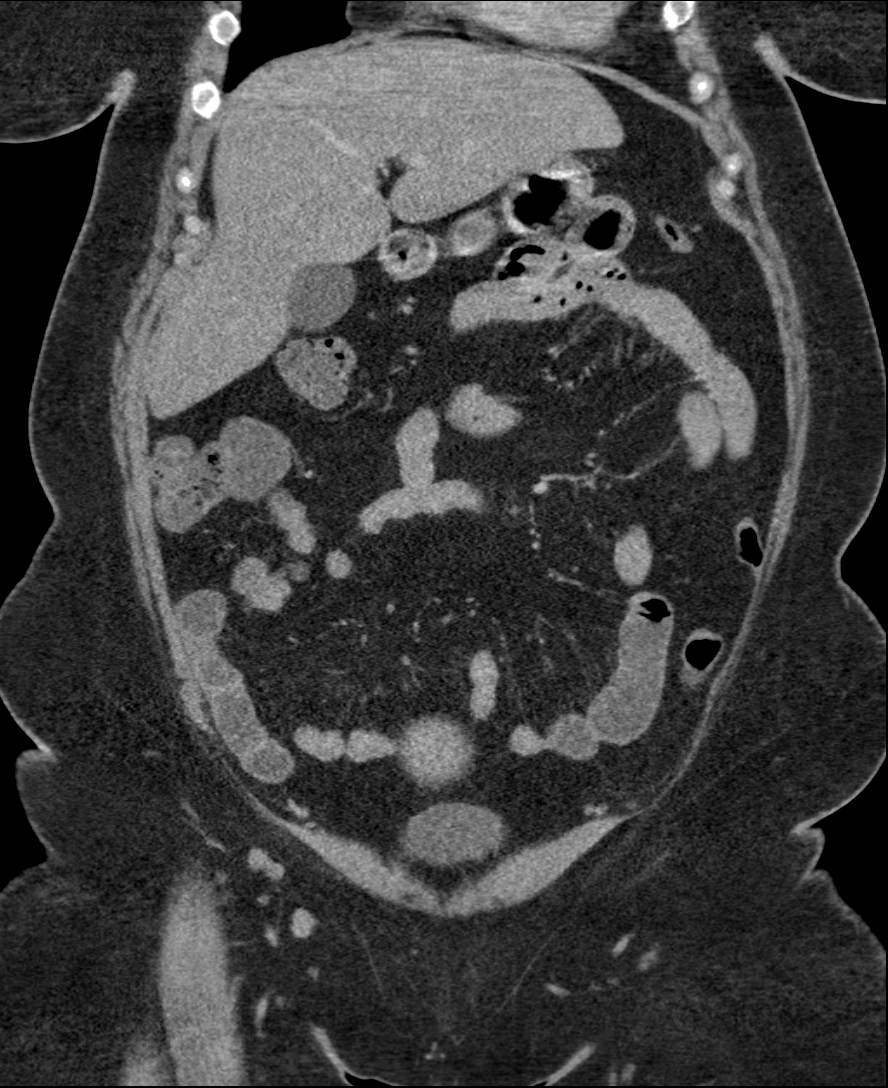
[im 66/148  soft-tissue]
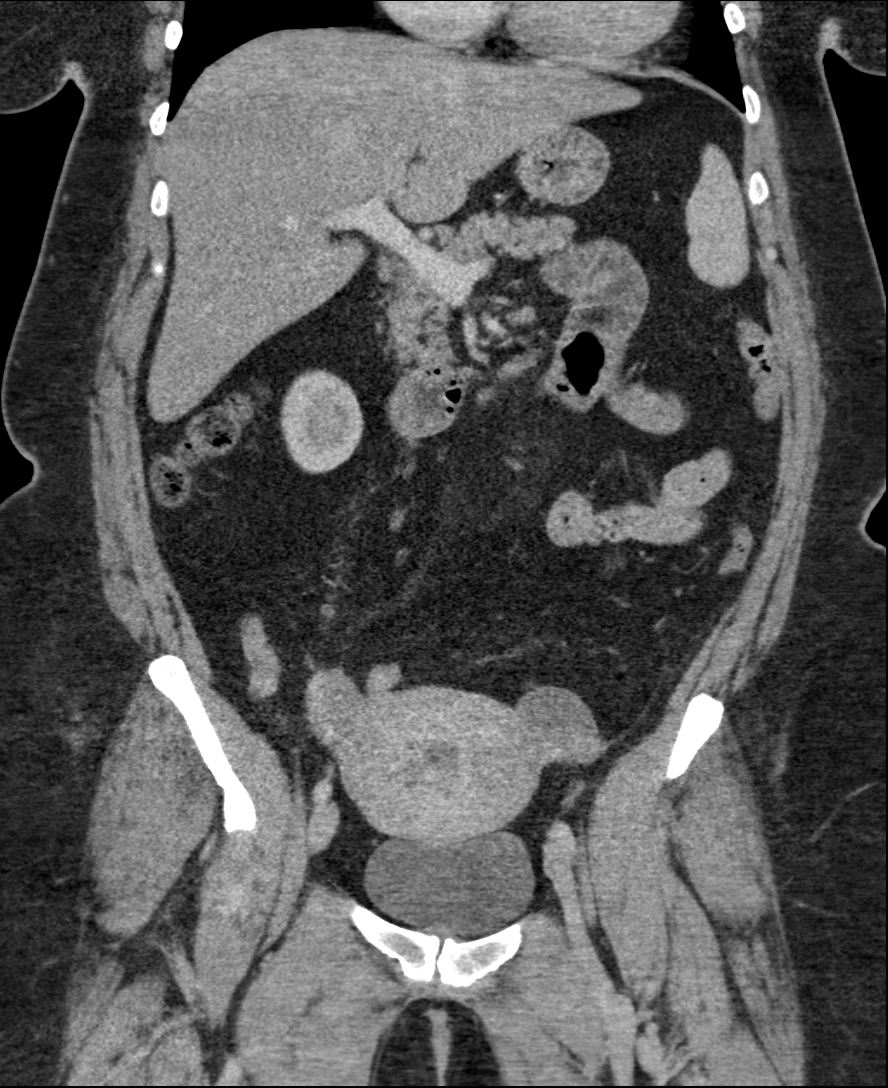
[im 82/148  soft-tissue]
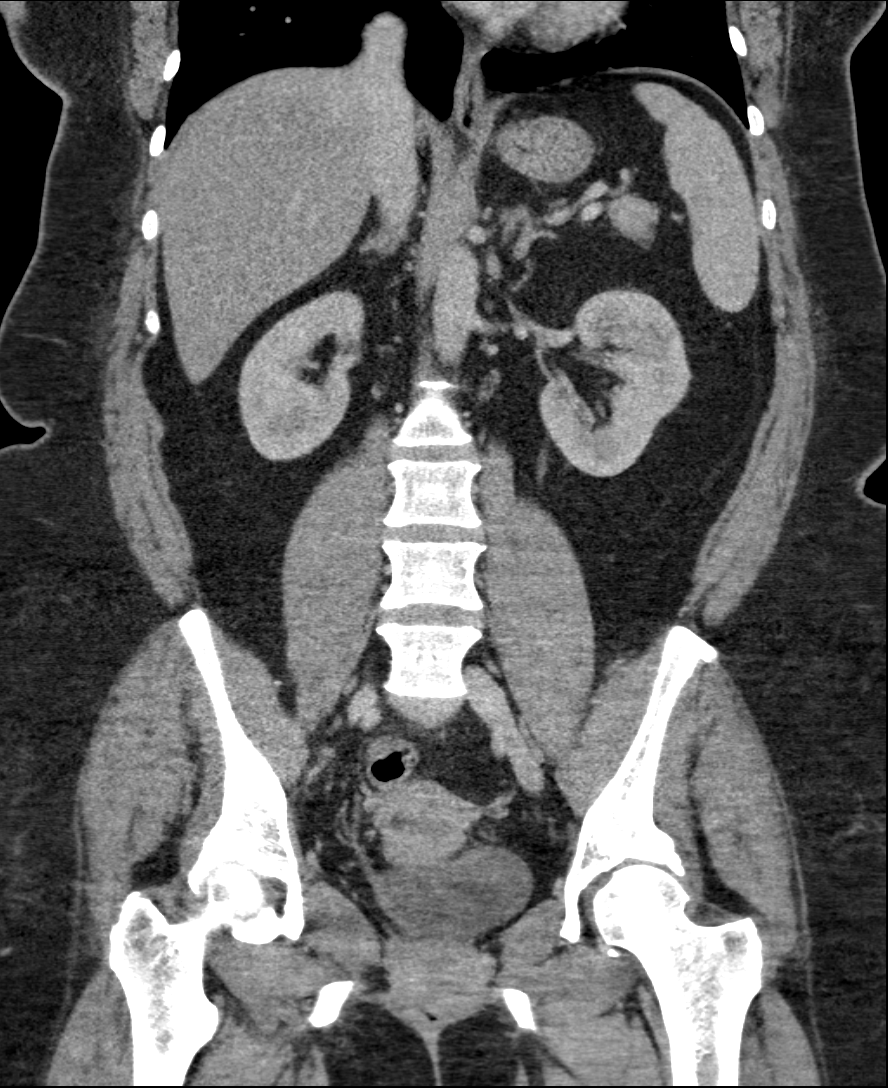

[11 of 46 positions shown; findings below may reference images not displayed]

FINDINGS: Lower chest: Lung bases are clear.

Hepatobiliary: No focal liver abnormality is seen. No gallstones,
gallbladder wall thickening, or biliary dilatation. Diffuse fatty
infiltration of the liver.

Pancreas: Unremarkable. No pancreatic ductal dilatation or
surrounding inflammatory changes.

Spleen: Normal in size without focal abnormality.

Adrenals/Urinary Tract: Adrenal glands are unremarkable. Kidneys are
normal, without renal calculi, focal lesion, or hydronephrosis.
Bladder is unremarkable.

Stomach/Bowel: Stomach is within normal limits. Appendix appears
normal. No evidence of bowel wall thickening, distention, or
inflammatory changes.

Vascular/Lymphatic: No significant vascular findings are present. No
enlarged abdominal or pelvic lymph nodes.

Reproductive: Uterus is anteverted. Nodular appearance to the uterus
consistent with fibroids. Ovaries are not enlarged.

Other: No abdominal wall hernia or abnormality. No abdominopelvic
ascites.

Musculoskeletal: No acute or significant osseous findings.
IMPRESSION: No acute process demonstrated in the abdomen or pelvis. No evidence
of bowel obstruction or inflammation. Uterine fibroids. Fatty
infiltration of the liver.

## 2018-02-12 DIAGNOSIS — S6991XA Unspecified injury of right wrist, hand and finger(s), initial encounter: Secondary | ICD-10-CM | POA: Diagnosis not present

## 2018-02-13 ENCOUNTER — Encounter (HOSPITAL_COMMUNITY): Payer: Self-pay | Admitting: Emergency Medicine

## 2018-02-13 ENCOUNTER — Emergency Department (HOSPITAL_COMMUNITY)
Admission: EM | Admit: 2018-02-13 | Discharge: 2018-02-13 | Disposition: A | Discharge status: Home / Self Care | Payer: Federal, State, Local not specified - PPO | Attending: Emergency Medicine | Admitting: Emergency Medicine

## 2018-02-13 DIAGNOSIS — X58XXXA Exposure to other specified factors, initial encounter: Secondary | ICD-10-CM | POA: Insufficient documentation

## 2018-02-13 DIAGNOSIS — Y998 Other external cause status: Secondary | ICD-10-CM | POA: Insufficient documentation

## 2018-02-13 DIAGNOSIS — I10 Essential (primary) hypertension: Secondary | ICD-10-CM | POA: Insufficient documentation

## 2018-02-13 DIAGNOSIS — E079 Disorder of thyroid, unspecified: Secondary | ICD-10-CM | POA: Insufficient documentation

## 2018-02-13 DIAGNOSIS — S60947A Unspecified superficial injury of left little finger, initial encounter: Secondary | ICD-10-CM | POA: Diagnosis not present

## 2018-02-13 DIAGNOSIS — S6992XA Unspecified injury of left wrist, hand and finger(s), initial encounter: Secondary | ICD-10-CM | POA: Diagnosis not present

## 2018-02-13 DIAGNOSIS — L089 Local infection of the skin and subcutaneous tissue, unspecified: Secondary | ICD-10-CM | POA: Insufficient documentation

## 2018-02-13 DIAGNOSIS — Z79899 Other long term (current) drug therapy: Secondary | ICD-10-CM | POA: Diagnosis not present

## 2018-02-13 DIAGNOSIS — Y9389 Activity, other specified: Secondary | ICD-10-CM | POA: Diagnosis not present

## 2018-02-13 DIAGNOSIS — Y929 Unspecified place or not applicable: Secondary | ICD-10-CM | POA: Diagnosis not present

## 2018-02-13 MED ORDER — CEPHALEXIN 500 MG PO CAPS: 500.0000 mg | ORAL_CAPSULE | Freq: Three times a day (TID) | ORAL | 0 refills | Status: DC

## 2018-02-13 MED ORDER — CEPHALEXIN 500 MG PO CAPS: 500.0000 mg | ORAL_CAPSULE | Freq: Four times a day (QID) | ORAL | 0 refills | Status: DC

## 2018-02-13 NOTE — ED Triage Notes (Signed)
Pt presents with swelling, redness, and puss draining from left pinky finger. Started on abx yesterday, pt reports redness has increased. Moves finger well, no change in sensation.

## 2018-02-13 NOTE — ED Provider Notes (Signed)
[facility]   CSN: [id number] Arrival date & time: 02/13/18  2207     History   Chief Complaint Chief Complaint  Patient presents with  . Hand Pain    HPI Suzanne Norton is a 47 y.o. female.  The history is provided by the patient. No language interpreter was used.  Hand Pain      47 year old female presenting for evaluation of left pinky finger infection.  Patient report approximately 10 days ago she had her nails done.  She had acrylic nail.  She noticed a small cut on the top of her skin near the nail fold and the next day it became more inflamed.  Since then it has loose out some clear fluid and placing up which concerns you.  She was seen at an outside urgent care center and was started on Bactrim antibiotic.  She took it for a total of 3 doses but today she noticed more redness to the affected area.  She denies any fever, chills, joint pain, numbness.  No history of diabetes.  She is up-to-date with tetanus.   Past Medical History:  Diagnosis Date  . Anemia   . Hypertension   . Thyroid disease     There are no active problems to display for this patient.   Past Surgical History:  Procedure Laterality Date  . HERNIA REPAIR       OB History   None      Home Medications    Prior to Admission medications   Medication Sig Start Date End Date Taking? Authorizing Provider  amLODipine (NORVASC) 2.5 MG tablet Take 2.5 mg by mouth daily.    [provider]  cephALEXin (KEFLEX) 500 MG capsule Take 1 capsule (500 mg total) by mouth 4 (four) times daily. 08/31/16   Vanetta MuldersZackowski, Scott, MD  hydrochlorothiazide (HYDRODIURIL) 25 MG tablet Take 25 mg by mouth daily.    [provider]  HYDROcodone-acetaminophen (NORCO/VICODIN) 5-325 MG tablet Take 1-2 tablets by mouth every 6 (six) hours as needed for moderate pain. 08/31/16   Vanetta MuldersZackowski, Scott, MD  levothyroxine (SYNTHROID, LEVOTHROID) 175 MCG tablet Take  175 mcg by mouth daily before breakfast.    [provider]  metoprolol succinate (TOPROL-XL) 25 MG 24 hr tablet Take 25 mg by mouth daily.    [provider]  potassium chloride SA (K-DUR,KLOR-CON) 20 MEQ tablet Take 20 mEq by mouth daily.    [provider]  promethazine (PHENERGAN) 25 MG tablet Take 1 tablet (25 mg total) by mouth every 6 (six) hours as needed for nausea or vomiting. 08/31/16   Vanetta MuldersZackowski, Scott, MD    Family History No family history on file.  Social History Social History   Tobacco Use  . Smoking status: Never Smoker  . Smokeless tobacco: Never Used  Substance Use Topics  . Alcohol use: No  . Drug use: Not on file     Allergies   Patient has no known allergies.   Review of Systems Review of Systems  Constitutional: Negative for fever.  Skin: Positive for rash.     Physical Exam Updated Vital Signs BP (!) 151/71 (BP Location: Right Arm)   Pulse 94   Temp 98.2 F (36.8 C) (Oral)   Resp 19   Ht 5\' 5"  (1.651 m)   Wt 124.7 kg (275 lb)   SpO2 100%   BMI 45.76 kg/m   Physical Exam  Constitutional: She appears well-developed and  well-nourished. No distress.  HENT:  Head: Atraumatic.  Eyes: Conjunctivae are normal.  Neck: Neck supple.  Neurological: She is alert.  Skin: Rash (Left pinky finger: An area of erythema with small blister oozing out serous fluid were noted to the dorsum of the distal phalanx adjacent to the nailbed without joint involvement.  Is mildly tender to palpation.  Acrylic nail in place.) noted.  Psychiatric: She has a normal mood and affect.  Nursing note and vitals reviewed.    ED Treatments / Results  Labs (all labs ordered are listed, but only abnormal results are displayed) Labs Reviewed - No data to display  EKG None  Radiology No results found.  Procedures Procedures (including critical care time)  Medications Ordered in ED Medications - No data to display   Initial Impression  / Assessment and Plan / ED Course  I have reviewed the triage vital signs and the nursing notes.  Pertinent labs & imaging results that were available during my care of the patient were reviewed by me and considered in my medical decision making (see chart for details).     BP (!) 151/71 (BP Location: Right Arm)   Pulse 94   Temp 98.2 F (36.8 C) (Oral)   Resp 19   Ht 5\' 5"  (1.651 m)   Wt 124.7 kg (275 lb)   SpO2 100%   BMI 45.76 kg/m    Final Clinical Impressions(s) / ED Diagnoses   Final diagnoses:  Superficial injury of left little finger with infection    ED Discharge Orders        Ordered    cephALEXin (KEFLEX) 500 MG capsule  4 times daily     02/13/18 2317    cephALEXin (KEFLEX) 500 MG capsule  3 times daily     02/13/18 2317     11:15 PM Patient has a localized infection noted to the dorsum of her left pinky finger at the distal phalanx adjacent to the nailbed.  It is likely cellulitis with blister formation.  No obvious abscess appreciated.  No joint involvement.  Patient just started her Bactrim which I encouraged to continue.  I will also add Keflex.  Encourage patient to return if symptoms worsen.  She is up-to-date.   Fayrene Helper, PA-C 02/13/18 2319    Mesner, Barbara Cower, MD 02/14/18 [phone]

## 2018-02-21 DIAGNOSIS — D509 Iron deficiency anemia, unspecified: Secondary | ICD-10-CM | POA: Diagnosis not present

## 2018-02-21 DIAGNOSIS — R7301 Impaired fasting glucose: Secondary | ICD-10-CM | POA: Diagnosis not present

## 2018-02-21 DIAGNOSIS — E039 Hypothyroidism, unspecified: Secondary | ICD-10-CM | POA: Diagnosis not present

## 2018-02-21 DIAGNOSIS — I10 Essential (primary) hypertension: Secondary | ICD-10-CM | POA: Diagnosis not present

## 2018-02-21 DIAGNOSIS — E7800 Pure hypercholesterolemia, unspecified: Secondary | ICD-10-CM | POA: Diagnosis not present

## 2018-03-31 DIAGNOSIS — H9311 Tinnitus, right ear: Secondary | ICD-10-CM | POA: Diagnosis not present

## 2018-08-18 ENCOUNTER — Ambulatory Visit: Payer: Federal, State, Local not specified - PPO

## 2018-08-18 ENCOUNTER — Encounter: Payer: Self-pay | Admitting: Emergency Medicine

## 2018-08-18 ENCOUNTER — Ambulatory Visit
Admission: EM | Admit: 2018-08-18 | Discharge: 2018-08-18 | Disposition: A | Discharge status: Home / Self Care | Payer: Federal, State, Local not specified - PPO | Attending: Family Medicine | Admitting: Family Medicine

## 2018-08-18 DIAGNOSIS — J019 Acute sinusitis, unspecified: Secondary | ICD-10-CM

## 2018-08-18 DIAGNOSIS — R0602 Shortness of breath: Secondary | ICD-10-CM | POA: Diagnosis not present

## 2018-08-18 DIAGNOSIS — R05 Cough: Secondary | ICD-10-CM | POA: Diagnosis not present

## 2018-08-18 MED ORDER — AMOXICILLIN-POT CLAVULANATE 875-125 MG PO TABS: 1.0000 | ORAL_TABLET | Freq: Two times a day (BID) | ORAL | 0 refills | Status: AC

## 2018-08-18 MED ORDER — PREDNISONE 50 MG PO TABS: ORAL_TABLET | ORAL | 0 refills | Status: AC

## 2018-08-18 MED ORDER — ALBUTEROL SULFATE HFA 108 (90 BASE) MCG/ACT IN AERS: 1.0000 | INHALATION_SPRAY | Freq: Four times a day (QID) | RESPIRATORY_TRACT | 0 refills | Status: AC | PRN

## 2018-08-18 MED ORDER — CETIRIZINE HCL 10 MG PO CAPS: 10.0000 mg | ORAL_CAPSULE | Freq: Every day | ORAL | 0 refills | Status: AC

## 2018-08-18 MED ORDER — BENZONATATE 200 MG PO CAPS: 200.0000 mg | ORAL_CAPSULE | Freq: Three times a day (TID) | ORAL | 0 refills | Status: AC | PRN

## 2018-08-18 NOTE — ED Provider Notes (Signed)
[facility]    CSN: [id number] Arrival date & time: 08/18/18  1824     History   Chief Complaint Chief Complaint  Patient presents with  . Shortness of Breath    HPI Suzanne Norton is a 48 y.o. female history of hypertension presenting today for evaluation of URI symptoms and shortness of breath.  Patient states that she has had cough and congestion for the past 1-1/2 weeks.  She notes that approximately 1 week ago she flew to [facility] which was at approximately our 45-minute plane ride.  While she was in the park she noted herself to be increasingly short of breath with exertion than normal.  She is also had fatigue.  Over the past couple of days she is noticed herself to be increasingly congested and is worried about her symptoms going into her chest.  She noted that she had some mild bilateral leg swelling with traveling, but this is normal for her.  Denies previous DVT/PE.  Denies smoking history.  Denies exogenous estrogen use.  Patient did have recent travel but was limited to less than 2 hours.  She is tried some Delsym and DayQuil with minimal relief.  Denies chest pain.  HPI  Past Medical History:  Diagnosis Date  . Anemia   . Hypertension   . Thyroid disease     There are no active problems to display for this patient.   Past Surgical History:  Procedure Laterality Date  . HERNIA REPAIR      OB History   No obstetric history on file.      Home Medications    Prior to Admission medications   Medication Sig Start Date End Date Taking? Authorizing Provider  amLODipine (NORVASC) 2.5 MG tablet Take 2.5 mg by mouth daily.   Yes [provider]  hydrochlorothiazide (HYDRODIURIL) 25 MG tablet Take 25 mg by mouth daily.   Yes [provider]  levothyroxine (SYNTHROID, LEVOTHROID) 175 MCG tablet Take 175 mcg by mouth daily before breakfast.   Yes [provider]  metoprolol succinate (TOPROL-XL) 25 MG 24 hr tablet Take 25 mg by  mouth daily.   Yes [provider]  potassium chloride SA (K-DUR,KLOR-CON) 20 MEQ tablet Take 20 mEq by mouth daily.   Yes [provider]  albuterol (PROVENTIL HFA;VENTOLIN HFA) 108 (90 Base) MCG/ACT inhaler Inhale 1-2 puffs into the lungs every 6 (six) hours as needed for wheezing or shortness of breath. 08/18/18   Wieters, Hallie C, PA-C  amoxicillin-clavulanate (AUGMENTIN) 875-125 MG tablet Take 1 tablet by mouth every 12 (twelve) hours for 10 days. 08/18/18 08/28/18  Wieters, Hallie C, PA-C  benzonatate (TESSALON) 200 MG capsule Take 1 capsule (200 mg total) by mouth 3 (three) times daily as needed for up to 7 days for cough. 08/18/18 08/25/18  Wieters, Hallie C, PA-C  Cetirizine HCl 10 MG CAPS Take 1 capsule (10 mg total) by mouth daily for 10 days. 08/18/18 08/28/18  Wieters, Hallie C, PA-C  predniSONE (DELTASONE) 50 MG tablet Take 1 tablet daily for the next 5 days with food 08/18/18   Wieters, Hallie C, PA-C  promethazine (PHENERGAN) 25 MG tablet Take 1 tablet (25 mg total) by mouth every 6 (six) hours as needed for nausea or vomiting. 08/31/16   Vanetta Mulders, MD    Family History History reviewed. No pertinent family history.  Social History Social History   Tobacco Use  . Smoking status: Never Smoker  . Smokeless tobacco: Never Used  Substance Use Topics  . Alcohol use: No  . Drug use: Not on file     Allergies   Codeine   Review of Systems Review of Systems  Constitutional: Positive for fatigue. Negative for activity change, appetite change, chills and fever.  HENT: Positive for congestion, rhinorrhea, sinus pressure and sore throat. Negative for ear pain and trouble swallowing.   Eyes: Negative for discharge and redness.  Respiratory: Positive for cough and shortness of breath. Negative for chest tightness and wheezing.   Cardiovascular: Negative for chest pain.  Gastrointestinal: Negative for abdominal pain, diarrhea, nausea and vomiting.  Musculoskeletal:  Negative for myalgias.  Skin: Negative for rash.  Neurological: Negative for dizziness, light-headedness and headaches.     Physical Exam Triage Vital Signs ED Triage Vitals  Enc Vitals Group     BP 08/18/18 1835 (!) 169/99     Pulse Rate 08/18/18 1835 87     Resp 08/18/18 1835 20     Temp 08/18/18 1835 97.8 F (36.6 C)     Temp Source 08/18/18 1835 Oral     SpO2 08/18/18 1835 97 %     Weight --      Height --      Head Circumference --      Peak Flow --      Pain Score 08/18/18 1836 0     Pain Loc --      Pain Edu? --      Excl. in GC? --    No data found.  Updated Vital Signs BP (!) 169/99 (BP Location: Right Arm)   Pulse 87   Temp 97.8 F (36.6 C) (Oral)   Resp 20   SpO2 97%   Visual Acuity Right Eye Distance:   Left Eye Distance:   Bilateral Distance:    Right Eye Near:   Left Eye Near:    Bilateral Near:     Physical Exam Vitals signs and nursing note reviewed.  Constitutional:      General: She is not in acute distress.    Appearance: She is well-developed.  HENT:     Head: Normocephalic and atraumatic.     Ears:     Comments: Bilateral ears without tenderness to palpation of external auricle, tragus and mastoid, EAC's without erythema or swelling, TM's with good bony landmarks and cone of light. Non erythematous.     Mouth/Throat:     Comments: Oral mucosa pink and moist, no tonsillar enlargement or exudate. Posterior pharynx patent and nonerythematous, no uvula deviation or swelling. Normal phonation. Eyes:     Conjunctiva/sclera: Conjunctivae normal.  Neck:     Musculoskeletal: Neck supple.  Cardiovascular:     Rate and Rhythm: Normal rate and regular rhythm.     Heart sounds: No murmur.  Pulmonary:     Effort: Pulmonary effort is normal. No respiratory distress.     Breath sounds: Normal breath sounds.     Comments: Breathing comfortably at rest, CTABL, no wheezing, rales or other adventitious sounds auscultated Abdominal:     Palpations:  Abdomen is soft.     Tenderness: There is no abdominal tenderness.  Skin:    General: Skin is warm and dry.  Neurological:     Mental Status: She is alert.      UC Treatments / Results  Labs (all labs ordered are listed, but only abnormal results are displayed) Labs Reviewed - No data to display  EKG None  Radiology Dg Chest 2 View  Result  Date: 08/18/2018 CLINICAL DATA:  Shortness of breath, cough, and congestion. EXAM: CHEST - 2 VIEW COMPARISON:  None. FINDINGS: The heart size and mediastinal contours are within normal limits. Both lungs are clear. The visualized skeletal structures are unremarkable. IMPRESSION: No active cardiopulmonary disease. Electronically Signed   By: Gerome Samavid  Williams III M.D   On: 08/18/2018 19:20    Procedures Procedures (including critical care time)  Medications Ordered in UC Medications - No data to display  Initial Impression / Assessment and Plan / UC Course  I have reviewed the triage vital signs and the nursing notes.  Pertinent labs & imaging results that were available during my care of the patient were reviewed by me and considered in my medical decision making (see chart for details).     Chest x-ray negative for pneumonia.  Will treat patient for sinusitis with Augmentin and prednisone.  Also recommended daily antihistamine, Tessalon for cough.  Albuterol inhaler as needed for shortness of breath.  Patient did have recent travel on an airplane, but was less than 2 hours, other risk factors negative for DVT/PE.  Will have patient monitor shortness of breath and follow-up in ED or with PCP if symptoms persisting.  Shortness of breath most likely related to URI symptoms.Discussed strict return precautions. Patient verbalized understanding and is agreeable with plan.  Final Clinical Impressions(s) / UC Diagnoses   Final diagnoses:  Acute sinusitis with symptoms > 10 days     Discharge Instructions     Please begin taking Augmentin twice  daily for the next 10 days, this will treat for sinus infection Please also use prednisone daily for the next 5 days with food on your stomach, take in the morning if you are able, this should help with inflammation in sinuses as well as lungs that could be contributing to your shortness of breath May use albuterol inhaler as needed for shortness of breath and any wheezing Begin daily cetirizine/Zyrtec to help with congestion and drainage Tessalon every 8 hours as needed for cough  Continue to monitor your temperature, breathing and symptoms, please follow-up if symptoms worsening, developing increased shortness of breath, difficulty breathing, chest pain, persistent symptoms.  If shortness of breath worsening please go to the emergency room.   ED Prescriptions    Medication Sig Dispense Auth. Provider   predniSONE (DELTASONE) 50 MG tablet Take 1 tablet daily for the next 5 days with food 5 tablet Wieters, Hallie C, PA-C   amoxicillin-clavulanate (AUGMENTIN) 875-125 MG tablet Take 1 tablet by mouth every 12 (twelve) hours for 10 days. 20 tablet Wieters, Hallie C, PA-C   Cetirizine HCl 10 MG CAPS Take 1 capsule (10 mg total) by mouth daily for 10 days. 10 capsule Wieters, Hallie C, PA-C   benzonatate (TESSALON) 200 MG capsule Take 1 capsule (200 mg total) by mouth 3 (three) times daily as needed for up to 7 days for cough. 28 capsule Wieters, Hallie C, PA-C   albuterol (PROVENTIL HFA;VENTOLIN HFA) 108 (90 Base) MCG/ACT inhaler Inhale 1-2 puffs into the lungs every 6 (six) hours as needed for wheezing or shortness of breath. 1 Inhaler Wieters, Hallie C, PA-C     Controlled Substance Prescriptions [place] Controlled Substance Registry consulted? Not Applicable   Lew DawesWieters, Hallie C, [place]PA-C 08/18/18 1952

## 2018-08-18 NOTE — Discharge Instructions (Addendum)
Please begin taking Augmentin twice daily for the next 10 days, this will treat for sinus infection Please also use prednisone daily for the next 5 days with food on your stomach, take in the morning if you are able, this should help with inflammation in sinuses as well as lungs that could be contributing to your shortness of breath May use albuterol inhaler as needed for shortness of breath and any wheezing Begin daily cetirizine/Zyrtec to help with congestion and drainage Tessalon every 8 hours as needed for cough  Continue to monitor your temperature, breathing and symptoms, please follow-up if symptoms worsening, developing increased shortness of breath, difficulty breathing, chest pain, persistent symptoms.  If shortness of breath worsening please go to the emergency room.

## 2018-08-18 NOTE — ED Triage Notes (Signed)
Pt presents to [facility] for assessment of shortness of breath after a URI 1.5 weeks ago.  Patient states she flew to [place] with the cold, and could not make it walking around the park without getting short of breath with exertion.  Fatigue, malaise.  Denies fevers.  C/o productive cough.

## 2018-08-18 NOTE — ED Notes (Signed)
Patient able to ambulate independently  

## 2018-08-27 DIAGNOSIS — S6992XA Unspecified injury of left wrist, hand and finger(s), initial encounter: Secondary | ICD-10-CM | POA: Diagnosis not present

## 2018-08-27 DIAGNOSIS — W19XXXA Unspecified fall, initial encounter: Secondary | ICD-10-CM | POA: Diagnosis not present

## 2018-09-10 DIAGNOSIS — R0602 Shortness of breath: Secondary | ICD-10-CM | POA: Diagnosis not present

## 2018-09-10 DIAGNOSIS — I10 Essential (primary) hypertension: Secondary | ICD-10-CM | POA: Diagnosis not present

## 2018-09-10 DIAGNOSIS — D509 Iron deficiency anemia, unspecified: Secondary | ICD-10-CM | POA: Diagnosis not present

## 2018-09-10 DIAGNOSIS — R7301 Impaired fasting glucose: Secondary | ICD-10-CM | POA: Diagnosis not present

## 2018-09-24 DIAGNOSIS — R7303 Prediabetes: Secondary | ICD-10-CM | POA: Diagnosis not present

## 2018-09-24 DIAGNOSIS — D509 Iron deficiency anemia, unspecified: Secondary | ICD-10-CM | POA: Diagnosis not present

## 2018-09-24 DIAGNOSIS — I10 Essential (primary) hypertension: Secondary | ICD-10-CM | POA: Diagnosis not present

## 2019-04-22 DIAGNOSIS — I10 Essential (primary) hypertension: Secondary | ICD-10-CM | POA: Diagnosis not present

## 2019-04-22 DIAGNOSIS — E039 Hypothyroidism, unspecified: Secondary | ICD-10-CM | POA: Diagnosis not present

## 2019-04-22 DIAGNOSIS — D509 Iron deficiency anemia, unspecified: Secondary | ICD-10-CM | POA: Diagnosis not present

## 2019-04-28 DIAGNOSIS — S60512A Abrasion of left hand, initial encounter: Secondary | ICD-10-CM | POA: Diagnosis not present

## 2019-05-04 DIAGNOSIS — E039 Hypothyroidism, unspecified: Secondary | ICD-10-CM | POA: Diagnosis not present

## 2019-05-04 DIAGNOSIS — Z6841 Body Mass Index (BMI) 40.0 and over, adult: Secondary | ICD-10-CM | POA: Diagnosis not present

## 2019-05-04 DIAGNOSIS — D509 Iron deficiency anemia, unspecified: Secondary | ICD-10-CM | POA: Diagnosis not present

## 2019-05-04 DIAGNOSIS — E7800 Pure hypercholesterolemia, unspecified: Secondary | ICD-10-CM | POA: Diagnosis not present

## 2019-05-04 DIAGNOSIS — R7303 Prediabetes: Secondary | ICD-10-CM | POA: Diagnosis not present

## 2019-05-04 DIAGNOSIS — I10 Essential (primary) hypertension: Secondary | ICD-10-CM | POA: Diagnosis not present

## 2019-12-25 DIAGNOSIS — D225 Melanocytic nevi of trunk: Secondary | ICD-10-CM | POA: Diagnosis not present

## 2019-12-25 DIAGNOSIS — D485 Neoplasm of uncertain behavior of skin: Secondary | ICD-10-CM | POA: Diagnosis not present

## 2019-12-25 DIAGNOSIS — L918 Other hypertrophic disorders of the skin: Secondary | ICD-10-CM | POA: Diagnosis not present

## 2020-01-13 DIAGNOSIS — D509 Iron deficiency anemia, unspecified: Secondary | ICD-10-CM | POA: Diagnosis not present

## 2020-01-13 DIAGNOSIS — R7309 Other abnormal glucose: Secondary | ICD-10-CM | POA: Diagnosis not present

## 2020-01-13 DIAGNOSIS — I10 Essential (primary) hypertension: Secondary | ICD-10-CM | POA: Diagnosis not present

## 2020-01-13 DIAGNOSIS — E7800 Pure hypercholesterolemia, unspecified: Secondary | ICD-10-CM | POA: Diagnosis not present

## 2020-01-13 DIAGNOSIS — E039 Hypothyroidism, unspecified: Secondary | ICD-10-CM | POA: Diagnosis not present

## 2020-01-13 DIAGNOSIS — Z0000 Encounter for general adult medical examination without abnormal findings: Secondary | ICD-10-CM | POA: Diagnosis not present

## 2020-02-29 DIAGNOSIS — R05 Cough: Secondary | ICD-10-CM | POA: Diagnosis not present

## 2020-02-29 DIAGNOSIS — R5383 Other fatigue: Secondary | ICD-10-CM | POA: Diagnosis not present

## 2020-02-29 DIAGNOSIS — J22 Unspecified acute lower respiratory infection: Secondary | ICD-10-CM | POA: Diagnosis not present

## 2020-02-29 DIAGNOSIS — J029 Acute pharyngitis, unspecified: Secondary | ICD-10-CM | POA: Diagnosis not present

## 2020-03-01 DIAGNOSIS — Z03818 Encounter for observation for suspected exposure to other biological agents ruled out: Secondary | ICD-10-CM | POA: Diagnosis not present

## 2020-03-01 DIAGNOSIS — R05 Cough: Secondary | ICD-10-CM | POA: Diagnosis not present

## 2020-03-02 DIAGNOSIS — B349 Viral infection, unspecified: Secondary | ICD-10-CM | POA: Diagnosis not present

## 2020-03-08 DIAGNOSIS — R05 Cough: Secondary | ICD-10-CM | POA: Diagnosis not present

## 2020-04-14 DIAGNOSIS — E876 Hypokalemia: Secondary | ICD-10-CM | POA: Diagnosis not present

## 2020-04-14 DIAGNOSIS — E039 Hypothyroidism, unspecified: Secondary | ICD-10-CM | POA: Diagnosis not present

## 2020-05-20 DIAGNOSIS — R7303 Prediabetes: Secondary | ICD-10-CM | POA: Diagnosis not present

## 2020-05-20 DIAGNOSIS — E876 Hypokalemia: Secondary | ICD-10-CM | POA: Diagnosis not present

## 2021-01-24 DIAGNOSIS — D500 Iron deficiency anemia secondary to blood loss (chronic): Secondary | ICD-10-CM | POA: Diagnosis not present

## 2021-01-24 DIAGNOSIS — I10 Essential (primary) hypertension: Secondary | ICD-10-CM | POA: Diagnosis not present

## 2021-01-24 DIAGNOSIS — R7309 Other abnormal glucose: Secondary | ICD-10-CM | POA: Diagnosis not present

## 2021-01-24 DIAGNOSIS — Z0000 Encounter for general adult medical examination without abnormal findings: Secondary | ICD-10-CM | POA: Diagnosis not present

## 2021-01-24 DIAGNOSIS — E039 Hypothyroidism, unspecified: Secondary | ICD-10-CM | POA: Diagnosis not present

## 2021-01-24 DIAGNOSIS — E7800 Pure hypercholesterolemia, unspecified: Secondary | ICD-10-CM | POA: Diagnosis not present

## 2021-01-27 ENCOUNTER — Other Ambulatory Visit: Payer: Self-pay | Admitting: Family Medicine

## 2021-01-27 DIAGNOSIS — R319 Hematuria, unspecified: Secondary | ICD-10-CM

## 2021-02-01 ENCOUNTER — Ambulatory Visit
Admission: RE | Admit: 2021-02-01 | Discharge: 2021-02-01 | Disposition: A | Discharge status: Home / Self Care | Payer: Federal, State, Local not specified - PPO | Source: Ambulatory Visit | Attending: Family Medicine | Admitting: Family Medicine

## 2021-02-01 ENCOUNTER — Other Ambulatory Visit: Payer: Self-pay

## 2021-02-01 DIAGNOSIS — R319 Hematuria, unspecified: Secondary | ICD-10-CM

## 2021-02-01 DIAGNOSIS — R109 Unspecified abdominal pain: Secondary | ICD-10-CM | POA: Diagnosis not present

## 2021-11-27 DIAGNOSIS — D128 Benign neoplasm of rectum: Secondary | ICD-10-CM | POA: Diagnosis not present

## 2021-11-27 DIAGNOSIS — D123 Benign neoplasm of transverse colon: Secondary | ICD-10-CM | POA: Diagnosis not present

## 2021-11-27 DIAGNOSIS — Z1211 Encounter for screening for malignant neoplasm of colon: Secondary | ICD-10-CM | POA: Diagnosis not present

## 2021-11-27 DIAGNOSIS — D120 Benign neoplasm of cecum: Secondary | ICD-10-CM | POA: Diagnosis not present

## 2022-02-05 DIAGNOSIS — E876 Hypokalemia: Secondary | ICD-10-CM | POA: Diagnosis not present

## 2022-02-05 DIAGNOSIS — E785 Hyperlipidemia, unspecified: Secondary | ICD-10-CM | POA: Diagnosis not present

## 2022-02-05 DIAGNOSIS — E039 Hypothyroidism, unspecified: Secondary | ICD-10-CM | POA: Diagnosis not present

## 2022-02-05 DIAGNOSIS — I10 Essential (primary) hypertension: Secondary | ICD-10-CM | POA: Diagnosis not present

## 2022-02-05 DIAGNOSIS — Z0000 Encounter for general adult medical examination without abnormal findings: Secondary | ICD-10-CM | POA: Diagnosis not present

## 2022-06-06 DIAGNOSIS — R7303 Prediabetes: Secondary | ICD-10-CM | POA: Diagnosis not present

## 2022-06-06 DIAGNOSIS — E039 Hypothyroidism, unspecified: Secondary | ICD-10-CM | POA: Diagnosis not present

## 2022-09-03 DIAGNOSIS — M7061 Trochanteric bursitis, right hip: Secondary | ICD-10-CM | POA: Diagnosis not present

## 2022-09-03 DIAGNOSIS — M1611 Unilateral primary osteoarthritis, right hip: Secondary | ICD-10-CM | POA: Diagnosis not present

## 2022-09-13 DIAGNOSIS — R2689 Other abnormalities of gait and mobility: Secondary | ICD-10-CM | POA: Diagnosis not present

## 2022-09-13 DIAGNOSIS — M7061 Trochanteric bursitis, right hip: Secondary | ICD-10-CM | POA: Diagnosis not present

## 2022-09-13 DIAGNOSIS — M25551 Pain in right hip: Secondary | ICD-10-CM | POA: Diagnosis not present

## 2022-09-18 DIAGNOSIS — R2689 Other abnormalities of gait and mobility: Secondary | ICD-10-CM | POA: Diagnosis not present

## 2022-09-18 DIAGNOSIS — M7061 Trochanteric bursitis, right hip: Secondary | ICD-10-CM | POA: Diagnosis not present

## 2022-09-18 DIAGNOSIS — M25551 Pain in right hip: Secondary | ICD-10-CM | POA: Diagnosis not present

## 2022-09-20 DIAGNOSIS — M7061 Trochanteric bursitis, right hip: Secondary | ICD-10-CM | POA: Diagnosis not present

## 2022-09-20 DIAGNOSIS — R2689 Other abnormalities of gait and mobility: Secondary | ICD-10-CM | POA: Diagnosis not present

## 2022-09-20 DIAGNOSIS — M25551 Pain in right hip: Secondary | ICD-10-CM | POA: Diagnosis not present

## 2022-09-24 DIAGNOSIS — M7061 Trochanteric bursitis, right hip: Secondary | ICD-10-CM | POA: Diagnosis not present

## 2022-09-24 DIAGNOSIS — R2689 Other abnormalities of gait and mobility: Secondary | ICD-10-CM | POA: Diagnosis not present

## 2022-09-24 DIAGNOSIS — M25551 Pain in right hip: Secondary | ICD-10-CM | POA: Diagnosis not present

## 2022-09-26 DIAGNOSIS — M25551 Pain in right hip: Secondary | ICD-10-CM | POA: Diagnosis not present

## 2022-09-26 DIAGNOSIS — R2689 Other abnormalities of gait and mobility: Secondary | ICD-10-CM | POA: Diagnosis not present

## 2022-09-26 DIAGNOSIS — M7061 Trochanteric bursitis, right hip: Secondary | ICD-10-CM | POA: Diagnosis not present

## 2022-11-21 DIAGNOSIS — Z1330 Encounter for screening examination for mental health and behavioral disorders, unspecified: Secondary | ICD-10-CM | POA: Diagnosis not present

## 2022-11-21 DIAGNOSIS — Z0000 Encounter for general adult medical examination without abnormal findings: Secondary | ICD-10-CM | POA: Diagnosis not present

## 2022-11-21 DIAGNOSIS — D509 Iron deficiency anemia, unspecified: Secondary | ICD-10-CM | POA: Diagnosis not present

## 2022-11-21 DIAGNOSIS — I10 Essential (primary) hypertension: Secondary | ICD-10-CM | POA: Diagnosis not present

## 2022-11-21 DIAGNOSIS — R739 Hyperglycemia, unspecified: Secondary | ICD-10-CM | POA: Diagnosis not present

## 2022-11-21 DIAGNOSIS — R600 Localized edema: Secondary | ICD-10-CM | POA: Diagnosis not present

## 2023-02-12 DIAGNOSIS — E039 Hypothyroidism, unspecified: Secondary | ICD-10-CM | POA: Diagnosis not present

## 2023-02-12 DIAGNOSIS — R7303 Prediabetes: Secondary | ICD-10-CM | POA: Diagnosis not present

## 2023-02-12 DIAGNOSIS — Z0000 Encounter for general adult medical examination without abnormal findings: Secondary | ICD-10-CM | POA: Diagnosis not present

## 2023-02-12 DIAGNOSIS — E782 Mixed hyperlipidemia: Secondary | ICD-10-CM | POA: Diagnosis not present

## 2023-02-12 DIAGNOSIS — Z23 Encounter for immunization: Secondary | ICD-10-CM | POA: Diagnosis not present

## 2023-02-12 DIAGNOSIS — I10 Essential (primary) hypertension: Secondary | ICD-10-CM | POA: Diagnosis not present

## 2023-02-26 DIAGNOSIS — Z1231 Encounter for screening mammogram for malignant neoplasm of breast: Secondary | ICD-10-CM | POA: Diagnosis not present

## 2023-03-26 DIAGNOSIS — R87610 Atypical squamous cells of undetermined significance on cytologic smear of cervix (ASC-US): Secondary | ICD-10-CM | POA: Diagnosis not present

## 2023-03-26 DIAGNOSIS — Z6841 Body Mass Index (BMI) 40.0 and over, adult: Secondary | ICD-10-CM | POA: Diagnosis not present

## 2023-03-26 DIAGNOSIS — Z01419 Encounter for gynecological examination (general) (routine) without abnormal findings: Secondary | ICD-10-CM | POA: Diagnosis not present

## 2023-03-26 DIAGNOSIS — N3281 Overactive bladder: Secondary | ICD-10-CM | POA: Diagnosis not present

## 2023-04-17 DIAGNOSIS — Z23 Encounter for immunization: Secondary | ICD-10-CM | POA: Diagnosis not present

## 2023-04-17 DIAGNOSIS — R748 Abnormal levels of other serum enzymes: Secondary | ICD-10-CM | POA: Diagnosis not present

## 2023-04-17 DIAGNOSIS — E039 Hypothyroidism, unspecified: Secondary | ICD-10-CM | POA: Diagnosis not present

## 2023-05-24 DIAGNOSIS — E039 Hypothyroidism, unspecified: Secondary | ICD-10-CM | POA: Diagnosis not present

## 2023-11-24 DIAGNOSIS — R051 Acute cough: Secondary | ICD-10-CM | POA: Diagnosis not present

## 2023-11-24 DIAGNOSIS — R0981 Nasal congestion: Secondary | ICD-10-CM | POA: Diagnosis not present

## 2023-12-24 DIAGNOSIS — E785 Hyperlipidemia, unspecified: Secondary | ICD-10-CM | POA: Diagnosis not present

## 2023-12-24 DIAGNOSIS — E876 Hypokalemia: Secondary | ICD-10-CM | POA: Diagnosis not present

## 2023-12-24 DIAGNOSIS — E039 Hypothyroidism, unspecified: Secondary | ICD-10-CM | POA: Diagnosis not present

## 2023-12-24 DIAGNOSIS — R7303 Prediabetes: Secondary | ICD-10-CM | POA: Diagnosis not present

## 2023-12-24 DIAGNOSIS — I10 Essential (primary) hypertension: Secondary | ICD-10-CM | POA: Diagnosis not present

## 2023-12-24 DIAGNOSIS — R232 Flushing: Secondary | ICD-10-CM | POA: Diagnosis not present

## 2023-12-24 DIAGNOSIS — D509 Iron deficiency anemia, unspecified: Secondary | ICD-10-CM | POA: Diagnosis not present

## 2024-01-23 DIAGNOSIS — L680 Hirsutism: Secondary | ICD-10-CM | POA: Diagnosis not present

## 2024-01-23 DIAGNOSIS — I10 Essential (primary) hypertension: Secondary | ICD-10-CM | POA: Diagnosis not present

## 2024-01-23 DIAGNOSIS — N3281 Overactive bladder: Secondary | ICD-10-CM | POA: Diagnosis not present

## 2024-01-23 DIAGNOSIS — E119 Type 2 diabetes mellitus without complications: Secondary | ICD-10-CM | POA: Diagnosis not present

## 2024-02-20 DIAGNOSIS — N3281 Overactive bladder: Secondary | ICD-10-CM | POA: Diagnosis not present

## 2024-02-20 DIAGNOSIS — E669 Obesity, unspecified: Secondary | ICD-10-CM | POA: Diagnosis not present

## 2024-02-20 DIAGNOSIS — I10 Essential (primary) hypertension: Secondary | ICD-10-CM | POA: Diagnosis not present

## 2024-04-30 DIAGNOSIS — N3281 Overactive bladder: Secondary | ICD-10-CM | POA: Diagnosis not present

## 2024-04-30 DIAGNOSIS — I10 Essential (primary) hypertension: Secondary | ICD-10-CM | POA: Diagnosis not present

## 2024-05-29 DIAGNOSIS — L237 Allergic contact dermatitis due to plants, except food: Secondary | ICD-10-CM | POA: Diagnosis not present

## 2024-05-29 DIAGNOSIS — L0390 Cellulitis, unspecified: Secondary | ICD-10-CM | POA: Diagnosis not present

## 2024-05-29 DIAGNOSIS — Z6841 Body Mass Index (BMI) 40.0 and over, adult: Secondary | ICD-10-CM | POA: Diagnosis not present

## 2024-06-09 DIAGNOSIS — M549 Dorsalgia, unspecified: Secondary | ICD-10-CM | POA: Diagnosis not present

## 2024-06-09 DIAGNOSIS — R319 Hematuria, unspecified: Secondary | ICD-10-CM | POA: Diagnosis not present

## 2024-06-23 DIAGNOSIS — N3946 Mixed incontinence: Secondary | ICD-10-CM | POA: Diagnosis not present

## 2024-06-23 DIAGNOSIS — N816 Rectocele: Secondary | ICD-10-CM | POA: Diagnosis not present

## 2024-06-23 DIAGNOSIS — E669 Obesity, unspecified: Secondary | ICD-10-CM | POA: Diagnosis not present

## 2024-06-23 DIAGNOSIS — N950 Postmenopausal bleeding: Secondary | ICD-10-CM | POA: Diagnosis not present

## 2024-07-03 DIAGNOSIS — N950 Postmenopausal bleeding: Secondary | ICD-10-CM | POA: Diagnosis not present

## 2024-07-03 DIAGNOSIS — E669 Obesity, unspecified: Secondary | ICD-10-CM | POA: Diagnosis not present
